# Patient Record
Sex: Female | Born: 1945
Health system: Southern US, Community
[De-identification: ages and names within clinical notes are randomized; demographics above are authoritative.]

## PROBLEM LIST (undated history)

## (undated) DIAGNOSIS — M1990 Unspecified osteoarthritis, unspecified site: Secondary | ICD-10-CM

## (undated) DIAGNOSIS — I10 Essential (primary) hypertension: Secondary | ICD-10-CM

## (undated) DIAGNOSIS — E785 Hyperlipidemia, unspecified: Secondary | ICD-10-CM

## (undated) DIAGNOSIS — F419 Anxiety disorder, unspecified: Secondary | ICD-10-CM

## (undated) DIAGNOSIS — C50919 Malignant neoplasm of unspecified site of unspecified female breast: Secondary | ICD-10-CM

## (undated) DIAGNOSIS — Z8249 Family history of ischemic heart disease and other diseases of the circulatory system: Secondary | ICD-10-CM

## (undated) DIAGNOSIS — B009 Herpesviral infection, unspecified: Secondary | ICD-10-CM

## (undated) DIAGNOSIS — D510 Vitamin B12 deficiency anemia due to intrinsic factor deficiency: Secondary | ICD-10-CM

## (undated) DIAGNOSIS — R600 Localized edema: Secondary | ICD-10-CM

## (undated) DIAGNOSIS — F17201 Nicotine dependence, unspecified, in remission: Secondary | ICD-10-CM

## (undated) DIAGNOSIS — G709 Myoneural disorder, unspecified: Secondary | ICD-10-CM

## (undated) DIAGNOSIS — Z87891 Personal history of nicotine dependence: Secondary | ICD-10-CM

## (undated) DIAGNOSIS — G4730 Sleep apnea, unspecified: Secondary | ICD-10-CM

## (undated) DIAGNOSIS — I77810 Thoracic aortic ectasia: Secondary | ICD-10-CM

## (undated) DIAGNOSIS — E039 Hypothyroidism, unspecified: Secondary | ICD-10-CM

## (undated) DIAGNOSIS — M79606 Pain in leg, unspecified: Secondary | ICD-10-CM

## (undated) DIAGNOSIS — Z923 Personal history of irradiation: Secondary | ICD-10-CM

## (undated) HISTORY — DX: Sleep apnea, unspecified: G47.30

## (undated) HISTORY — DX: Localized edema: R60.0

## (undated) HISTORY — PX: APPENDECTOMY: SHX54

## (undated) HISTORY — DX: Unspecified osteoarthritis, unspecified site: M19.90

## (undated) HISTORY — DX: Hypothyroidism, unspecified: E03.9

## (undated) HISTORY — DX: Herpesviral infection, unspecified: B00.9

## (undated) HISTORY — DX: Anxiety disorder, unspecified: F41.9

## (undated) HISTORY — PX: TUBAL LIGATION: SHX77

## (undated) HISTORY — PX: TONSILLECTOMY: SUR1361

## (undated) HISTORY — PX: TYMPANOPLASTY: SHX33

## (undated) HISTORY — PX: ARTHROSCOPY WITH ANTERIOR CRUCIATE LIGAMENT (ACL) REPAIR WITH ANTERIOR TIBILIAS GRAFT: SHX6503

## (undated) HISTORY — DX: Hyperlipidemia, unspecified: E78.5

## (undated) HISTORY — DX: Myoneural disorder, unspecified: G70.9

## (undated) HISTORY — DX: Vitamin B12 deficiency anemia due to intrinsic factor deficiency: D51.0

## (undated) HISTORY — PX: JOINT REPLACEMENT: SHX530

---
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
## 2008-04-29 HISTORY — PX: TOTAL HIP ARTHROPLASTY: SHX124

## 2011-08-02 ENCOUNTER — Ambulatory Visit: Payer: Self-pay

## 2012-01-15 ENCOUNTER — Ambulatory Visit: Payer: Self-pay | Admitting: Internal Medicine

## 2012-03-24 ENCOUNTER — Ambulatory Visit: Payer: Self-pay | Admitting: Podiatry

## 2012-04-29 HISTORY — PX: BREAST LUMPECTOMY: SHX2

## 2013-01-15 ENCOUNTER — Ambulatory Visit: Payer: Self-pay | Admitting: Internal Medicine

## 2013-01-27 ENCOUNTER — Ambulatory Visit: Payer: Self-pay | Admitting: Internal Medicine

## 2013-01-27 DIAGNOSIS — C50919 Malignant neoplasm of unspecified site of unspecified female breast: Secondary | ICD-10-CM

## 2013-01-27 HISTORY — DX: Malignant neoplasm of unspecified site of unspecified female breast: C50.919

## 2013-02-01 ENCOUNTER — Ambulatory Visit: Payer: Self-pay | Admitting: Internal Medicine

## 2013-02-17 LAB — PATHOLOGY REPORT

## 2013-02-18 ENCOUNTER — Ambulatory Visit: Payer: Self-pay | Admitting: Radiation Oncology

## 2013-02-18 ENCOUNTER — Ambulatory Visit: Payer: Self-pay | Admitting: Surgery

## 2013-02-18 LAB — CBC WITH DIFFERENTIAL/PLATELET
Basophil #: 0 10*3/uL (ref 0.0–0.1)
Eosinophil #: 0.1 10*3/uL (ref 0.0–0.7)
Eosinophil %: 1.8 %
HCT: 39.2 % (ref 35.0–47.0)
HGB: 13.7 g/dL (ref 12.0–16.0)
Lymphocyte %: 17.8 %
MCV: 94 fL (ref 80–100)
Monocyte #: 0.5 x10 3/mm (ref 0.2–0.9)
Monocyte %: 10 %
Neutrophil %: 70 %
RDW: 13.9 % (ref 11.5–14.5)
WBC: 4.8 10*3/uL (ref 3.6–11.0)

## 2013-02-18 LAB — BASIC METABOLIC PANEL
Anion Gap: 3 — ABNORMAL LOW (ref 7–16)
BUN: 14 mg/dL (ref 7–18)
Calcium, Total: 9.4 mg/dL (ref 8.5–10.1)
Chloride: 103 mmol/L (ref 98–107)
Co2: 29 mmol/L (ref 21–32)
Creatinine: 0.75 mg/dL (ref 0.60–1.30)
EGFR (Non-African Amer.): >60
Glucose: 87 mg/dL (ref 65–99)
Potassium: 4.6 mmol/L (ref 3.5–5.1)
Sodium: 135 mmol/L — ABNORMAL LOW (ref 136–145)

## 2013-02-25 ENCOUNTER — Ambulatory Visit: Payer: Self-pay | Admitting: Surgery

## 2013-02-25 HISTORY — PX: BREAST BIOPSY: SHX20

## 2013-02-27 ENCOUNTER — Ambulatory Visit: Payer: Self-pay | Admitting: Radiation Oncology

## 2013-03-29 ENCOUNTER — Ambulatory Visit: Payer: Self-pay | Admitting: Radiation Oncology

## 2013-04-08 ENCOUNTER — Ambulatory Visit: Payer: Self-pay | Admitting: Podiatry

## 2013-04-15 LAB — CBC CANCER CENTER
Basophil %: 0.7 %
Eosinophil #: 0.1 x10 3/mm (ref 0.0–0.7)
Eosinophil %: 2.3 %
Lymphocyte %: 16.6 %
MCH: 31.7 pg (ref 26.0–34.0)
MCHC: 33.4 g/dL (ref 32.0–36.0)
MCV: 95 fL (ref 80–100)
Monocyte #: 0.4 x10 3/mm (ref 0.2–0.9)
Platelet: 194 x10 3/mm (ref 150–440)
RBC: 4.3 10*6/uL (ref 3.80–5.20)
WBC: 4.1 x10 3/mm (ref 3.6–11.0)

## 2013-04-29 ENCOUNTER — Ambulatory Visit: Payer: Self-pay | Admitting: Radiation Oncology

## 2013-04-29 HISTORY — PX: BUNIONECTOMY: SHX129

## 2013-05-06 LAB — CBC CANCER CENTER
BASOS ABS: 0 x10 3/mm (ref 0.0–0.1)
Basophil %: 0.5 %
Eosinophil #: 0.1 x10 3/mm (ref 0.0–0.7)
Eosinophil %: 1.5 %
HCT: 41 % (ref 35.0–47.0)
HGB: 13.7 g/dL (ref 12.0–16.0)
Lymphocyte #: 0.6 x10 3/mm — ABNORMAL LOW (ref 1.0–3.6)
Lymphocyte %: 14.5 %
MCH: 31.9 pg (ref 26.0–34.0)
MCHC: 33.3 g/dL (ref 32.0–36.0)
MCV: 96 fL (ref 80–100)
MONO ABS: 0.5 x10 3/mm (ref 0.2–0.9)
Monocyte %: 11.6 %
NEUTROS ABS: 3.1 x10 3/mm (ref 1.4–6.5)
NEUTROS PCT: 71.9 %
PLATELETS: 188 x10 3/mm (ref 150–440)
RBC: 4.28 10*6/uL (ref 3.80–5.20)
RDW: 13.9 % (ref 11.5–14.5)
WBC: 4.3 x10 3/mm (ref 3.6–11.0)

## 2013-05-13 LAB — CBC CANCER CENTER
Basophil #: 0 x10 3/mm (ref 0.0–0.1)
Basophil %: 0.4 %
Eosinophil #: 0.1 x10 3/mm (ref 0.0–0.7)
Eosinophil %: 1.3 %
HCT: 40.8 % (ref 35.0–47.0)
HGB: 13.7 g/dL (ref 12.0–16.0)
LYMPHS ABS: 0.6 x10 3/mm — AB (ref 1.0–3.6)
Lymphocyte %: 12.9 %
MCH: 31.8 pg (ref 26.0–34.0)
MCHC: 33.4 g/dL (ref 32.0–36.0)
MCV: 95 fL (ref 80–100)
MONO ABS: 0.4 x10 3/mm (ref 0.2–0.9)
Monocyte %: 8.8 %
NEUTROS PCT: 76.6 %
Neutrophil #: 3.5 x10 3/mm (ref 1.4–6.5)
Platelet: 202 x10 3/mm (ref 150–440)
RBC: 4.29 10*6/uL (ref 3.80–5.20)
RDW: 14.2 % (ref 11.5–14.5)
WBC: 4.6 x10 3/mm (ref 3.6–11.0)

## 2013-05-20 LAB — CBC CANCER CENTER
Basophil #: 0 x10 3/mm (ref 0.0–0.1)
Basophil %: 0.6 %
Eosinophil #: 0.1 x10 3/mm (ref 0.0–0.7)
Eosinophil %: 1.2 %
HCT: 41.5 % (ref 35.0–47.0)
HGB: 14 g/dL (ref 12.0–16.0)
LYMPHS PCT: 11.9 %
Lymphocyte #: 0.6 x10 3/mm — ABNORMAL LOW (ref 1.0–3.6)
MCH: 32 pg (ref 26.0–34.0)
MCHC: 33.8 g/dL (ref 32.0–36.0)
MCV: 95 fL (ref 80–100)
MONOS PCT: 8.8 %
Monocyte #: 0.4 x10 3/mm (ref 0.2–0.9)
Neutrophil #: 3.9 x10 3/mm (ref 1.4–6.5)
Neutrophil %: 77.5 %
Platelet: 193 x10 3/mm (ref 150–440)
RBC: 4.38 10*6/uL (ref 3.80–5.20)
RDW: 14 % (ref 11.5–14.5)
WBC: 5 x10 3/mm (ref 3.6–11.0)

## 2013-05-24 LAB — CBC CANCER CENTER
BASOS PCT: 0.6 %
Basophil #: 0 x10 3/mm (ref 0.0–0.1)
EOS ABS: 0.1 x10 3/mm (ref 0.0–0.7)
Eosinophil %: 1.4 %
HCT: 39.7 % (ref 35.0–47.0)
HGB: 13.3 g/dL (ref 12.0–16.0)
LYMPHS ABS: 0.5 x10 3/mm — AB (ref 1.0–3.6)
Lymphocyte %: 11.9 %
MCH: 32 pg (ref 26.0–34.0)
MCHC: 33.6 g/dL (ref 32.0–36.0)
MCV: 95 fL (ref 80–100)
MONO ABS: 0.3 x10 3/mm (ref 0.2–0.9)
MONOS PCT: 8.4 %
NEUTROS PCT: 77.7 %
Neutrophil #: 3.1 x10 3/mm (ref 1.4–6.5)
Platelet: 194 x10 3/mm (ref 150–440)
RBC: 4.17 10*6/uL (ref 3.80–5.20)
RDW: 13.7 % (ref 11.5–14.5)
WBC: 4 x10 3/mm (ref 3.6–11.0)

## 2013-05-24 LAB — HEPATIC FUNCTION PANEL A (ARMC)
ALBUMIN: 3.8 g/dL (ref 3.4–5.0)
ALK PHOS: 88 U/L
AST: 25 U/L (ref 15–37)
BILIRUBIN DIRECT: 0.1 mg/dL (ref 0.00–0.20)
Bilirubin,Total: 0.3 mg/dL (ref 0.2–1.0)
SGPT (ALT): 30 U/L (ref 12–78)
Total Protein: 7.2 g/dL (ref 6.4–8.2)

## 2013-05-24 LAB — CREATININE, SERUM
CREATININE: 0.72 mg/dL (ref 0.60–1.30)
EGFR (African American): >60
EGFR (Non-African Amer.): >60

## 2013-05-30 ENCOUNTER — Ambulatory Visit: Payer: Self-pay | Admitting: Radiation Oncology

## 2013-06-27 LAB — HM COLONOSCOPY

## 2013-06-28 ENCOUNTER — Ambulatory Visit: Payer: Self-pay | Admitting: Internal Medicine

## 2013-08-12 ENCOUNTER — Ambulatory Visit: Payer: Self-pay | Admitting: Podiatry

## 2013-09-15 ENCOUNTER — Ambulatory Visit: Payer: Self-pay | Admitting: Physician Assistant

## 2013-11-01 ENCOUNTER — Ambulatory Visit: Payer: Self-pay | Admitting: Internal Medicine

## 2013-11-01 LAB — CBC CANCER CENTER
BASOS PCT: 0.4 %
Basophil #: 0 x10 3/mm (ref 0.0–0.1)
Eosinophil #: 0 x10 3/mm (ref 0.0–0.7)
Eosinophil %: 1 %
HCT: 37.4 % (ref 35.0–47.0)
HGB: 12.7 g/dL (ref 12.0–16.0)
Lymphocyte #: 0.6 x10 3/mm — ABNORMAL LOW (ref 1.0–3.6)
Lymphocyte %: 14.1 %
MCH: 32.2 pg (ref 26.0–34.0)
MCHC: 33.9 g/dL (ref 32.0–36.0)
MCV: 95 fL (ref 80–100)
MONO ABS: 0.3 x10 3/mm (ref 0.2–0.9)
Monocyte %: 7.5 %
NEUTROS ABS: 3.1 x10 3/mm (ref 1.4–6.5)
Neutrophil %: 77 %
Platelet: 187 x10 3/mm (ref 150–440)
RBC: 3.94 10*6/uL (ref 3.80–5.20)
RDW: 13.7 % (ref 11.5–14.5)
WBC: 4 x10 3/mm (ref 3.6–11.0)

## 2013-11-01 LAB — HEPATIC FUNCTION PANEL A (ARMC)
ALK PHOS: 60 U/L
Albumin: 3.7 g/dL (ref 3.4–5.0)
BILIRUBIN TOTAL: 0.3 mg/dL (ref 0.2–1.0)
Bilirubin, Direct: 0.1 mg/dL (ref 0.00–0.20)
SGOT(AST): 22 U/L (ref 15–37)
SGPT (ALT): 27 U/L (ref 12–78)
Total Protein: 6.9 g/dL (ref 6.4–8.2)

## 2013-11-01 LAB — CREATININE, SERUM
CREATININE: 0.69 mg/dL (ref 0.60–1.30)
EGFR (African American): >60
EGFR (Non-African Amer.): >60

## 2013-11-27 ENCOUNTER — Ambulatory Visit: Payer: Self-pay | Admitting: Internal Medicine

## 2013-12-28 LAB — HM MAMMOGRAPHY

## 2013-12-30 ENCOUNTER — Ambulatory Visit: Payer: Self-pay | Admitting: Internal Medicine

## 2014-01-10 LAB — TSH: TSH: 2.9 u[IU]/mL (ref <=5.90)

## 2014-01-10 LAB — LIPID PANEL
CHOLESTEROL: 161 mg/dL (ref 0–200)
HDL: 53 mg/dL (ref 35–70)
LDL Cholesterol: 72 mg/dL
Triglycerides: 182 mg/dL — AB (ref 40–160)

## 2014-01-10 LAB — CBC AND DIFFERENTIAL: Hemoglobin: 13 g/dL (ref 12.0–16.0)

## 2014-01-18 ENCOUNTER — Ambulatory Visit: Payer: Self-pay | Admitting: Internal Medicine

## 2014-01-27 ENCOUNTER — Ambulatory Visit: Payer: Self-pay | Admitting: Internal Medicine

## 2014-02-11 DIAGNOSIS — G6282 Radiation-induced polyneuropathy: Secondary | ICD-10-CM | POA: Insufficient documentation

## 2014-02-11 DIAGNOSIS — G609 Hereditary and idiopathic neuropathy, unspecified: Secondary | ICD-10-CM

## 2014-02-11 DIAGNOSIS — M722 Plantar fascial fibromatosis: Secondary | ICD-10-CM | POA: Insufficient documentation

## 2014-02-11 DIAGNOSIS — M79606 Pain in leg, unspecified: Secondary | ICD-10-CM

## 2014-02-11 DIAGNOSIS — R202 Paresthesia of skin: Secondary | ICD-10-CM

## 2014-02-11 HISTORY — DX: Pain in leg, unspecified: M79.606

## 2014-04-07 ENCOUNTER — Ambulatory Visit: Payer: Self-pay | Admitting: Internal Medicine

## 2014-04-07 LAB — HEPATIC FUNCTION PANEL A (ARMC)
ALBUMIN: 3.5 g/dL (ref 3.4–5.0)
Alkaline Phosphatase: 55 U/L
BILIRUBIN TOTAL: 0.4 mg/dL (ref 0.2–1.0)
Bilirubin, Direct: 0.1 mg/dL (ref 0.0–0.2)
SGOT(AST): 25 U/L (ref 15–37)
SGPT (ALT): 29 U/L
Total Protein: 6.8 g/dL (ref 6.4–8.2)

## 2014-04-07 LAB — CBC CANCER CENTER
Basophil #: 0 x10 3/mm (ref 0.0–0.1)
Basophil %: 0.4 %
EOS PCT: 1 %
Eosinophil #: 0.1 x10 3/mm (ref 0.0–0.7)
HCT: 37.8 % (ref 35.0–47.0)
HGB: 12.5 g/dL (ref 12.0–16.0)
LYMPHS ABS: 0.6 x10 3/mm — AB (ref 1.0–3.6)
LYMPHS PCT: 11.8 %
MCH: 31.9 pg (ref 26.0–34.0)
MCHC: 33.2 g/dL (ref 32.0–36.0)
MCV: 96 fL (ref 80–100)
MONO ABS: 0.5 x10 3/mm (ref 0.2–0.9)
MONOS PCT: 8.9 %
Neutrophil #: 4 x10 3/mm (ref 1.4–6.5)
Neutrophil %: 77.9 %
Platelet: 197 x10 3/mm (ref 150–440)
RBC: 3.93 10*6/uL (ref 3.80–5.20)
RDW: 13.6 % (ref 11.5–14.5)
WBC: 5.1 x10 3/mm (ref 3.6–11.0)

## 2014-04-07 LAB — CREATININE, SERUM
CREATININE: 0.77 mg/dL (ref 0.60–1.30)
EGFR (Non-African Amer.): >60

## 2014-04-29 ENCOUNTER — Ambulatory Visit: Payer: Self-pay | Admitting: Internal Medicine

## 2014-06-09 DIAGNOSIS — R202 Paresthesia of skin: Secondary | ICD-10-CM | POA: Diagnosis not present

## 2014-06-09 DIAGNOSIS — M722 Plantar fascial fibromatosis: Secondary | ICD-10-CM | POA: Diagnosis not present

## 2014-06-09 DIAGNOSIS — R270 Ataxia, unspecified: Secondary | ICD-10-CM | POA: Diagnosis not present

## 2014-06-09 DIAGNOSIS — G6282 Radiation-induced polyneuropathy: Secondary | ICD-10-CM | POA: Diagnosis not present

## 2014-07-20 DIAGNOSIS — B001 Herpesviral vesicular dermatitis: Secondary | ICD-10-CM | POA: Diagnosis not present

## 2014-07-20 DIAGNOSIS — Z85820 Personal history of malignant melanoma of skin: Secondary | ICD-10-CM | POA: Diagnosis not present

## 2014-07-20 DIAGNOSIS — Z08 Encounter for follow-up examination after completed treatment for malignant neoplasm: Secondary | ICD-10-CM | POA: Diagnosis not present

## 2014-07-20 DIAGNOSIS — Z1283 Encounter for screening for malignant neoplasm of skin: Secondary | ICD-10-CM | POA: Diagnosis not present

## 2014-07-20 DIAGNOSIS — Z872 Personal history of diseases of the skin and subcutaneous tissue: Secondary | ICD-10-CM | POA: Diagnosis not present

## 2014-08-02 ENCOUNTER — Ambulatory Visit
Admit: 2014-08-02 | Disposition: A | Discharge status: Home / Self Care | Payer: Self-pay | Attending: Internal Medicine | Admitting: Internal Medicine

## 2014-08-02 DIAGNOSIS — R921 Mammographic calcification found on diagnostic imaging of breast: Secondary | ICD-10-CM | POA: Diagnosis not present

## 2014-08-02 DIAGNOSIS — R928 Other abnormal and inconclusive findings on diagnostic imaging of breast: Secondary | ICD-10-CM | POA: Diagnosis not present

## 2014-08-19 NOTE — H&P (Signed)
PATIENT NAME:  Cheyenne Cummings, Cheyenne Cummings MR#:  [id number] DATE OF BIRTH:  [birth year]  DATE OF ADMISSION:  02/25/2013  CHIEF COMPLAINT: Right ductal carcinoma in situ.   HISTORY OF PRESENT ILLNESS: This is a patient with a core biopsy on the right showing DCIS, low-grade cribriform type. She is here for elective right partial mastectomy with sentinel node biopsy.   PAST MEDICAL HISTORY: Anxiety disorder, edema, hyperlipidemia, hypothyroidism and osteoarthritis.   PAST SURGICAL HISTORY: Tonsillectomy, adenoidectomy, foot surgery, appendectomy and hip replacement.   FAMILY HISTORY: No history of breast cancer.  GYNECOLOGIC HISTORY: G2, P2, Ab0.   SOCIAL HISTORY: The patient is a retired Radio producer. Smokes tobacco. Does not drink alcohol.   REVIEW OF SYSTEMS: Ten-system review has been performed and negative with the exception of that mentioned in the HPI.  PHYSICAL EXAMINATION: GENERAL: Healthy-appearing female patient.  HEENT: No scleral icterus.  NECK: No palpable neck nodes.  CHEST: Clear to auscultation.  CARDIAC: Regular rate and rhythm.  ABDOMEN: Soft, nontender.  EXTREMITIES: Without edema.  NEUROLOGIC: Grossly intact.  SKIN: Right-sided breast biopsy site is healing well without associated mass.   LABORATORY VALUES: Normal electrolytes are noted. Hemoglobin and hematocrit of 13.7 and 39 and a platelet count of 196.   ASSESSMENT AND PLAN: This is a patient with ductal carcinoma in situ, low-grade, of the right breast on core needle biopsy. She requires further excision for margins, will likely require radiotherapy. A sentinel node biopsy will be performed with needle localization, breast partial mastectomy. The plan for this has been reviewed with her. The risks of bleeding, infection, recurrence of disease and need for further therapy either as anti-estrogen or additional chemotherapy, the risk of invasive component being identified changing her stage and necessitating additional  therapy, and also the need for radiotherapy have been discussed. She is seeing Dr. Baruch Gouty and the plan has been agreed upon.   Also of note, she had some fullness in the anterior mediastinum seen on chest x-ray, and a CT scan will be scheduled for postop in the near future.    ____________________________ Jerrol Banana Burt Knack, MD rec:jm D: 02/24/2013 17:09:44 ET T: 02/24/2013 18:22:26 ET JOB#: [id number]  cc: Jerrol Banana. Burt Knack, MD, <Dictator> Florene Glen MD ELECTRONICALLY SIGNED 02/25/2013 18:26

## 2014-08-19 NOTE — Consult Note (Signed)
Reason for Visit: This 69 year old Female patient presents to the clinic for initial evaluation of  breast cancer .   Referred by Dr. Burt Knack.  Diagnosis:  Chief Complaint/Diagnosis   probable stage 0 (Tis N0 M0) ductal carcinoma in situ of the right breast seen prior to lumpectomy  Pathology Report pathology report reviewed   Imaging Report mammograms reviewed   Referral Report clinical notes reviewed   Planned Treatment Regimen probable whole breast radiation   HPI   patient is a69 year old female who presents with an abnormal mammogram of the right breast showing suspicious calcifications in the 3:00 position. Recommend him patient of biopsy was made based on the pleomorphic nature in the upper right breast approximately 3:00 position. She underwent a core biopsy by Dr. Burt Knack showing ductal carcinoma in situ andmicropapular and perform pattern. Tumor was strongly ER/PR positive. She is now scheduled for lumpectomy and possible sentinel node biopsy and is seen today prior to any did definitive treatment. She is doing well and without complaint. She specifically denies breast tenderness cough or bone pain.  Past Hx:    DCIS:    Depression/Anxiety:    Hypothyroidism:    Hypercholesterolemia:   Past, Family and Social History:  Past Medical History positive   Cardiovascular hyperlipidemia   Neurological/Psychiatric anxiety; depression   Family History positive   Family History Comments sister with diverticulitis, maternal grandmother with cancer of unknown type   Social History positive   Social History Comments no smoking history positive social EtOH use history   Additional Past Medical and Surgical History seen accompanied by nurse navigator today   Allergies:   No Known Allergies:   Home Meds:  Home Medications: Medication Instructions Status  levothyroxine 75 mcg (0.075 mg) oral tablet 1 tab(s) orally once a day Active  lovastatin 40 mg oral tablet 1 tab(s)  orally once a day (at bedtime) Active  PROzac 20 mg oral capsule 1 cap(s) orally once a day Active  azelastine nasal 137 mcg/inh nasal spray 2 spray(s) nasal 2 times a day, As Needed Active  Tylenol PM 500 mg-25 mg oral tablet 1 tab(s) orally once a day (at bedtime), As Needed Active  calcium citrate 1 tab(s) orally once a day 500mg  Active  Vitamin D3 1000 intl units oral capsule 1 cap(s) orally once a day Active  mvi 1 tab(s) orally once a day Active  Vitamin B-12 1 tab(s) sublingual once a day 2536mcq Active  naproxen sodium sodium 220 mg oral tablet 1 tab(s) orally every 8 hours, As Needed Active  Allegra 180 mg oral tablet 1 tab(s) orally once a day Active  acyclovir 200 mg oral capsule 1 cap(s) orally 5 times a day, As Needed Active   Review of Systems:  General negative   Performance Status (ECOG) 0   Skin negative   Breast see HPI   Ophthalmologic negative   ENMT negative   Respiratory and Thorax negative   Cardiovascular negative   Gastrointestinal negative   Genitourinary negative   Musculoskeletal negative   Neurological negative   Psychiatric negative   Hematology/Lymphatics negative   Endocrine negative   Allergic/Immunologic negative   Review of Systems   review of systems obtained from nurses notes  Nursing Notes:  Nursing Vital Signs and Chemo Nursing Nursing Notes: *CC Vital Signs Flowsheet:   23-Oct-14 10:12  Temp Temperature 98.7  Pulse Pulse 69  Respirations Respirations 20  SBP SBP 134  DBP DBP 88  Pain Scale (0-10)  0  Current Weight (kg) (kg) 64.6  Height (cm) centimeters 162.5  BSA (m2) 1.6   Physical Exam:  General/Skin/HEENT:  General normal   Skin normal   Eyes normal   ENMT normal   Head and Neck normal   Additional PE well-developed well-nourished female in NAD. Lungs are clear to A&P cardiac examination shows regular rate and rhythm. No dominant mass or nodularity is noted in either breast into position examined.  No axillary or supraclavicular adenopathy is identified.   Breasts/Resp/CV/GI/GU:  Respiratory and Thorax normal   Cardiovascular normal   Gastrointestinal normal   Genitourinary normal   MS/Neuro/Psych/Lymph:  Musculoskeletal normal   Neurological normal   Lymphatics normal   Other Results:  Radiology Results: LabUnknown:    01-Oct-14 09:15, Digital Additional Views Rt Breast (SCR)  PACS Image   [facility]:  Digital Additional Views Rt Breast (SCR)   REASON FOR EXAM:    AV RT CALCIFICATIONS  COMMENTS:       PROCEDURE: MAM - MAM DIG ADDVIEWS RT SCR  - Jan 27 2013  9:15AM     CLINICAL DATA:  Screening recall for right breast calcifications    EXAM:  DIGITAL DIAGNOSTIC rightMAMMOGRAM    COMPARISON:Previous mammograms.    ACR Breast Density Category b: There is a scattered fibroglandular  pattern.  FINDINGS:  There are pleomorphic calcifications in the outer right breast at  the approximate 3 o'clock position spanning a distance of  approximately 15 mm. This is confirmed on the additional spot  compression magnification views.     IMPRESSION:  Suspicious right breast calcifications.    RECOMMENDATION:  Stereotactic guided biopsy of the suspicious calcifications in the  right breast at 3 o'clock is recommended. The patient's physician  will 1st be notified and then this will be scheduled at the  patient's earliest convenience.  I have discussed the findings and recommendations with the patient.  Results were also provided in writing at the conclusion of the  visit. If applicable, a reminder letter will be sent to the patient  regarding the next appointment.    BI-RADS CATEGORY  4: Suspicious abnormality - biopsy should be  considered.      Electronically Signed    By: Everlean Alstrom M.D.    On: 01/27/2013 13:58         Verified By: Trudee Kuster, M.D.,   Relevent Results:   Relevant Scans and Labs mammograms were reviewed   Assessment  and Plan: Impression:   probable ductal carcinoma in situ of the right breast prior to definitive lumpectomy in 69 old female. Plan:   at this time I got over treatment recommendations for adjuvant radiation therapy. Based on her. DCIS nature she is cautionary for accelerated partial breast irradiation by PCR guidelines. I would opt to treat her whole breast to 5000 cGy and boost or scar depending on final pathology results. She scheduled next week for wide local excision and I'll followup 2 weeks after that for more definitive recommendations about her treatments. She also would be a candidate for tamoxifen therapy based on her ER/PR status after completion of radiation. All questions concerns were answered.  I would like to take this opportunity to thank you for allowing me to continue to participate in this patient's care.  CC Referral:  cc: Dr. Burt Knack, Dr. Army Melia   Electronic Signatures: Baruch Gouty, Roda Shutters (MD)  (Signed 23-Oct-14 12:32)  Authored: HPI, Diagnosis, Past Hx, PFSH, Allergies, Home Meds, ROS, Nursing Notes, Physical Exam,  Other Results, Relevent Results, Encounter Assessment and Plan, CC Referring Physician   Last Updated: 23-Oct-14 12:32 by Armstead Peaks (MD)

## 2014-08-19 NOTE — Op Note (Signed)
PATIENT NAME:  Cheyenne Cummings, Cheyenne Cummings MR#:  [id number] DATE OF BIRTH:  [birth year]  DATE OF PROCEDURE:  02/25/2013  PREOPERATIVE DIAGNOSIS: Right breast ductal carcinoma in situ.  POSTOPERATIVE DIAGNOSIS: Right breast ductal carcinoma in situ.  PROCEDURE: Right needle localized partial mastectomy and right sentinel node biopsy.   SURGEON: Phoebe Perch, M.D.   ANESTHESIA: General with LMA.   INDICATIONS: This is a patient with DCIS on core needle biopsy, but a fairly diffuse area seen on mammography. Preoperatively, we discussed rationale for partial mastectomy with needle localization. We also discussed the controversy and the possibility of a sentinel node biopsy.   I reviewed the films with the radiologist prior to the beginning of the surgery discussing the diffuse nature of this disease.   He had bracketed the radiographic margins with 3 wires and this was reviewed preoperatively.   The patient and I reviewed the films and discussed the procedure itself and the rationale for offering sentinel node biopsy in a diffuse area that may contain invasive components not representative of the core needle biopsy. This was reviewed for her and she understood and agreed to proceed. We also discussed the risks of bleeding, infection, recurrence of disease and need for additional therapy including radiotherapy, suppression therapy and possible cytotoxic chemotherapy if invasive complements were noted. She understood and agreed to proceed.   FINDINGS: Bracketed area with 3 needle localization wires. One of the wires was cut at the tip. The tip was not seen in the specimen nor in the wound itself nor was it palpable and was believed to be within the specimen. This was on the most medial cephalad portion of the specimen. The specimen was labeled long lateral/short superior sutures.   Sentinel node biopsy was sent for examination.   I discussed with Dr. Luana Cummings of pathology the rationale for doing a sentinel node  biopsy in this patient and discussed the patient's pathology specimens prior to sending them.   DESCRIPTION OF PROCEDURE: The patient was induced to general anesthesia, prepped and draped in sterile fashion. She was injected with radioactive technetium tracer preoperatively and with Lymphazurin blue preoperatively.   Transcutaneous scanning of the right axilla demonstrated an area of high counts. This was marked and an incision was made. Dissection down to what appeared to be 2 nodes was performed. There was very little blue dye in the nodes but they had high counts and these were sent off for examination. A separate area of what appeared to be fatty tissue with no node palpable within it was removed as well and sent off together. Further scanning of the cavity failed to identify any additional counts.   The area was checked for hemostasis, found to be adequate. It was closed with deep sutures of 3-0 Vicryl followed by 4-0 subcuticular Monocryl.   Attention was turned to the 3 wires in the lateral portion of the breast. A lenticular-shaped incision was utilized to draw out and encompass 2 of 3  wires and the core needle biopsy site. The incision was executed with sharp dissection and the third wire was brought into the incision as well. The specimen was labeled with the nylon sutures  as above and dissection was performed in a rather generous portion anteriorly, cephalad and medial. The wire that coursed in that direction was found to not contain the very tip barb portion of the wire. Palpation of the specimen and palpation of the cavity walls failed to identify any further wire within the specimen or in the  patient and it was believed to be within the specimen because of the location within the specimen of the wire itself. The area was anesthetized with additional Marcaine for a total of 30 mL. Hemostasis was with electrocautery. The specimen was passed off for examination.   Further inspection failed to  identify the barb portion of the wire and was believed to be small and left in situ or in the specimen. No specimen mammogram was performed due to the bracketing nature of the wires.   Once assuring that hemostasis was adequate and that it had been checked multiple times, the wound was closed with 2 layers of deep sutures of 3-0 Vicryl followed by 4-0 subcuticular Monocryl. Steri-Strips, Mastisol and sterile dressings were placed on all wounds. She was taken to the recovery room in stable condition to be discharged in the care of her family, follow up in 10 days.   ____________________________ Jerrol Banana Burt Knack, MD rec:sb D: 02/25/2013 13:44:45 ET T: 02/25/2013 14:25:23 ET JOB#: [id number]  cc: Jerrol Banana. Burt Knack, MD, <Dictator> Florene Glen MD ELECTRONICALLY SIGNED 02/25/2013 18:26

## 2014-09-01 ENCOUNTER — Other Ambulatory Visit: Payer: Self-pay

## 2014-09-01 DIAGNOSIS — D0591 Unspecified type of carcinoma in situ of right breast: Secondary | ICD-10-CM

## 2014-09-07 DIAGNOSIS — M79604 Pain in right leg: Secondary | ICD-10-CM | POA: Diagnosis not present

## 2014-09-07 DIAGNOSIS — G6282 Radiation-induced polyneuropathy: Secondary | ICD-10-CM | POA: Diagnosis not present

## 2014-09-07 DIAGNOSIS — M722 Plantar fascial fibromatosis: Secondary | ICD-10-CM | POA: Diagnosis not present

## 2014-09-07 DIAGNOSIS — R270 Ataxia, unspecified: Secondary | ICD-10-CM | POA: Diagnosis not present

## 2014-09-23 DIAGNOSIS — E785 Hyperlipidemia, unspecified: Secondary | ICD-10-CM | POA: Diagnosis not present

## 2014-09-23 DIAGNOSIS — B9689 Other specified bacterial agents as the cause of diseases classified elsewhere: Secondary | ICD-10-CM | POA: Diagnosis not present

## 2014-09-23 DIAGNOSIS — R202 Paresthesia of skin: Secondary | ICD-10-CM | POA: Diagnosis not present

## 2014-09-23 DIAGNOSIS — J329 Chronic sinusitis, unspecified: Secondary | ICD-10-CM | POA: Diagnosis not present

## 2014-10-01 ENCOUNTER — Encounter: Payer: Self-pay | Admitting: Internal Medicine

## 2014-10-01 ENCOUNTER — Other Ambulatory Visit: Payer: Self-pay | Admitting: Internal Medicine

## 2014-10-01 DIAGNOSIS — R600 Localized edema: Secondary | ICD-10-CM | POA: Insufficient documentation

## 2014-10-01 DIAGNOSIS — B009 Herpesviral infection, unspecified: Secondary | ICD-10-CM | POA: Insufficient documentation

## 2014-10-01 DIAGNOSIS — C50919 Malignant neoplasm of unspecified site of unspecified female breast: Secondary | ICD-10-CM | POA: Insufficient documentation

## 2014-10-01 DIAGNOSIS — Z86010 Personal history of colonic polyps: Secondary | ICD-10-CM | POA: Insufficient documentation

## 2014-10-01 DIAGNOSIS — F064 Anxiety disorder due to known physiological condition: Secondary | ICD-10-CM | POA: Insufficient documentation

## 2014-10-01 DIAGNOSIS — D510 Vitamin B12 deficiency anemia due to intrinsic factor deficiency: Secondary | ICD-10-CM | POA: Insufficient documentation

## 2014-10-01 DIAGNOSIS — M1710 Unilateral primary osteoarthritis, unspecified knee: Secondary | ICD-10-CM | POA: Insufficient documentation

## 2014-10-01 DIAGNOSIS — Z87891 Personal history of nicotine dependence: Secondary | ICD-10-CM

## 2014-10-01 DIAGNOSIS — I10 Essential (primary) hypertension: Secondary | ICD-10-CM | POA: Insufficient documentation

## 2014-10-01 DIAGNOSIS — R208 Other disturbances of skin sensation: Secondary | ICD-10-CM | POA: Insufficient documentation

## 2014-10-01 DIAGNOSIS — E039 Hypothyroidism, unspecified: Secondary | ICD-10-CM | POA: Insufficient documentation

## 2014-10-01 DIAGNOSIS — E785 Hyperlipidemia, unspecified: Secondary | ICD-10-CM | POA: Insufficient documentation

## 2014-10-01 DIAGNOSIS — Z8249 Family history of ischemic heart disease and other diseases of the circulatory system: Secondary | ICD-10-CM | POA: Insufficient documentation

## 2014-10-01 DIAGNOSIS — M179 Osteoarthritis of knee, unspecified: Secondary | ICD-10-CM | POA: Insufficient documentation

## 2014-10-01 DIAGNOSIS — J3089 Other allergic rhinitis: Secondary | ICD-10-CM | POA: Insufficient documentation

## 2014-10-01 DIAGNOSIS — I454 Nonspecific intraventricular block: Secondary | ICD-10-CM | POA: Insufficient documentation

## 2014-10-01 HISTORY — DX: Personal history of nicotine dependence: Z87.891

## 2014-10-01 HISTORY — DX: Family history of ischemic heart disease and other diseases of the circulatory system: Z82.49

## 2014-10-06 ENCOUNTER — Inpatient Hospital Stay: Payer: Medicare Other | Attending: Internal Medicine | Admitting: Internal Medicine

## 2014-10-06 ENCOUNTER — Other Ambulatory Visit: Payer: Self-pay | Admitting: *Deleted

## 2014-10-06 ENCOUNTER — Inpatient Hospital Stay: Payer: Medicare Other

## 2014-10-06 ENCOUNTER — Encounter: Payer: Self-pay | Admitting: Internal Medicine

## 2014-10-06 VITALS — BP 126/72 | HR 76 | Temp 98.1°F | Resp 18 | Ht 65.0 in | Wt 142.0 lb

## 2014-10-06 DIAGNOSIS — Z809 Family history of malignant neoplasm, unspecified: Secondary | ICD-10-CM | POA: Diagnosis not present

## 2014-10-06 DIAGNOSIS — M1990 Unspecified osteoarthritis, unspecified site: Secondary | ICD-10-CM | POA: Insufficient documentation

## 2014-10-06 DIAGNOSIS — Z9011 Acquired absence of right breast and nipple: Secondary | ICD-10-CM

## 2014-10-06 DIAGNOSIS — E039 Hypothyroidism, unspecified: Secondary | ICD-10-CM | POA: Diagnosis not present

## 2014-10-06 DIAGNOSIS — G629 Polyneuropathy, unspecified: Secondary | ICD-10-CM

## 2014-10-06 DIAGNOSIS — D0511 Intraductal carcinoma in situ of right breast: Secondary | ICD-10-CM

## 2014-10-06 DIAGNOSIS — F419 Anxiety disorder, unspecified: Secondary | ICD-10-CM

## 2014-10-06 DIAGNOSIS — Z79810 Long term (current) use of selective estrogen receptor modulators (SERMs): Secondary | ICD-10-CM | POA: Diagnosis not present

## 2014-10-06 DIAGNOSIS — Z923 Personal history of irradiation: Secondary | ICD-10-CM | POA: Insufficient documentation

## 2014-10-06 DIAGNOSIS — Z79899 Other long term (current) drug therapy: Secondary | ICD-10-CM | POA: Diagnosis not present

## 2014-10-06 DIAGNOSIS — Z87891 Personal history of nicotine dependence: Secondary | ICD-10-CM

## 2014-10-06 DIAGNOSIS — E785 Hyperlipidemia, unspecified: Secondary | ICD-10-CM | POA: Insufficient documentation

## 2014-10-06 DIAGNOSIS — R609 Edema, unspecified: Secondary | ICD-10-CM | POA: Diagnosis not present

## 2014-10-06 DIAGNOSIS — Z170 Estrogen receptor positive status [ER+]: Secondary | ICD-10-CM | POA: Diagnosis not present

## 2014-10-06 LAB — HEPATIC FUNCTION PANEL
ALK PHOS: 44 U/L (ref 38–126)
ALT: 18 U/L (ref 14–54)
AST: 22 U/L (ref 15–41)
Albumin: 3.6 g/dL (ref 3.5–5.0)
BILIRUBIN TOTAL: 0.3 mg/dL (ref 0.3–1.2)
Total Protein: 6.7 g/dL (ref 6.5–8.1)

## 2014-10-06 LAB — CBC WITH DIFFERENTIAL/PLATELET
BASOS PCT: 1 %
Basophils Absolute: 0 10*3/uL (ref 0–0.1)
Eosinophils Absolute: 0.1 10*3/uL (ref 0–0.7)
Eosinophils Relative: 3 %
HCT: 36.6 % (ref 35.0–47.0)
Hemoglobin: 12.2 g/dL (ref 12.0–16.0)
LYMPHS ABS: 0.7 10*3/uL — AB (ref 1.0–3.6)
LYMPHS PCT: 20 %
MCH: 30.6 pg (ref 26.0–34.0)
MCHC: 33.4 g/dL (ref 32.0–36.0)
MCV: 91.7 fL (ref 80.0–100.0)
MONO ABS: 0.3 10*3/uL (ref 0.2–0.9)
Monocytes Relative: 9 %
Neutro Abs: 2.5 10*3/uL (ref 1.4–6.5)
Neutrophils Relative %: 67 %
PLATELETS: 201 10*3/uL (ref 150–440)
RBC: 3.99 MIL/uL (ref 3.80–5.20)
RDW: 13.4 % (ref 11.5–14.5)
WBC: 3.7 10*3/uL (ref 3.6–11.0)

## 2014-10-06 LAB — CREATININE, SERUM
Creatinine, Ser: 0.66 mg/dL (ref 0.44–1.00)
GFR calc Af Amer: >60 mL/min (ref >60)

## 2014-10-06 NOTE — Progress Notes (Signed)
Pt here for DICS f/u last mammogram 08/02/14 on right breast showing calcifications. No c/o. Quit smoking 4 months ago, no alcohol intake. Eating and drinking good.

## 2014-10-23 NOTE — Progress Notes (Signed)
[place]  Telephone:([phone] Fax:([phone]) [phone]     ID: Cheyenne Cummings OB: 1945-08-12  MR#: [id number]  IOE#:[id number]  No care team member to display  CHIEF COMPLAINT/DIAGNOSIS:  Ductal Carcinoma In Situ (grade 1, micropapillary) of the right breast initially diagnosed by right breast biopsy done 02/01/13, then had right partial mastectomy and sentinel node study on 02/25/13 with surgical pathology reporting DCIS nuclear grade 1, micropapillary type, margins of excision are negative, one sentinel lymph node negative. ER positive (>90%), PR positive (>90%).  Patient completed radiation, then started adjuvant hormonal therapy with Tamoxifen on 05/24/13.    HISTORY OF PRESENT ILLNESS:  Patient returns for continued oncology followup. States she is doing steady, denies new complaints. She has been on tamoxifen treatment since January 2015. States that chronic neuropathy symptoms are same, also has intermittent difficulty in balancing. Otherwise, states she remains. pysically active. Eating well, no unintentional weight loss. Denies any history of DVT, PE, TIA, stroke, or heart attack. Denies feeling any new breast masses on self exam. No other new pain issues, 0/10.   REVIEW OF SYSTEMS:   ROS As in HPI above. In addition, no fever, chills or sweats. No new headaches or focal weakness.  No new mood disturbances. No  sore throat, cough, shortness of breath, sputum, hemoptysis or chest pain. No dizziness or palpitation. No abdominal pain, constipation, diarrhea, dysuria or hematuria. No new skin rash or bleeding symptoms. No polyuria polydipsia.    PAST MEDICAL HISTORY: Reviewed. Past Medical History  Diagnosis Date  . Hyperlipidemia   . Hypothyroidism   . Osteoarthritis   . Anxiety disorder   . Herpes simplex   . Edema extremities     PAST SURGICAL HISTORY: Reviewed. Past Surgical History  Procedure Laterality Date  . Breast lumpectomy Right 2014    DCIS - XRT  and Chemo  . Tonsillectomy    . Total hip arthroplasty  2010  . Arthroscopy with anterior cruciate ligament (acl) repair with anterior tibilias graft Bilateral 1996, 2003  . Tympanoplasty    . Bunionectomy Right 2015  . Breast lumpectomy Right     FAMILY HISTORY: Reviewed. Family History  Problem Relation Age of Onset  . Cancer Maternal Grandmother     ADVANCED DIRECTIVES:  Yes  SOCIAL HISTORY: Reviewed. History  Substance Use Topics  . Smoking status: Former Smoker    Quit date: 04/29/2014  . Smokeless tobacco: Never Used  . Alcohol Use: Not on file    Allergies  Allergen Reactions  . Ace Inhibitors Cough    Current Outpatient Prescriptions  Medication Sig Dispense Refill  . acyclovir (ZOVIRAX) 200 MG capsule Take 1 capsule by mouth 3 (three) times daily as needed (for fever blisters).     . cholecalciferol (VITAMIN D) 1000 UNITS tablet Take 1,000 Units by mouth daily.    . clonazePAM (KLONOPIN) 0.5 MG tablet Take 0.5 mg by mouth 2 (two) times daily.    . cyanocobalamin 1000 MCG tablet Take 1 tablet by mouth daily.    . fexofenadine (ALLEGRA) 180 MG tablet Take 1 tablet by mouth daily.    Marland Kitchen levothyroxine (SYNTHROID, LEVOTHROID) 75 MCG tablet Take 1 tablet by mouth daily.    Marland Kitchen losartan (COZAAR) 50 MG tablet Take 1 tablet by mouth daily.    Marland Kitchen lovastatin (MEVACOR) 40 MG tablet Take 1 tablet by mouth at  bedtime 90 tablet 3  . Multiple Vitamins-Minerals (MULTIVITAMIN WITH MINERALS) tablet Take 1 tablet by mouth daily.    Marland Kitchen  Naproxen Sodium 220 MG CAPS Take 1 capsule by mouth 2 (two) times daily as needed.    . tamoxifen (NOLVADEX) 20 MG tablet Take 1 tablet by mouth daily.    Marland Kitchen venlafaxine XR (EFFEXOR-XR) 75 MG 24 hr capsule Take 1 capsule by mouth daily.     No current facility-administered medications for this visit.    PHYSICAL EXAM: Filed Vitals:   10/06/14 0919  BP: 126/72  Pulse: 76  Temp: 98.1 F (36.7 C)  Resp: 18     Body mass index is 23.63 kg/(m^2).       GENERAL: Patient is alert and oriented and in no acute distress. There is no icterus. HEENT: EOMs intact.  No cervical lymphadenopathy. CVS: S1S2, regular LUNGS: Bilaterally clear to auscultation, no rhonchi. ABDOMEN: Soft, nontender. No hepatomegaly clinically.  EXTREMITIES: No pedal edema.    LAB RESULTS:    Component Value Date/Time   NA 135* 02/18/2013 0913   K 4.6 02/18/2013 0913   CL 103 02/18/2013 0913   CO2 29 02/18/2013 0913   GLUCOSE 87 02/18/2013 0913   BUN 14 02/18/2013 0913   CREATININE 0.66 10/06/2014 0855   CREATININE 0.77 04/07/2014 0945   CALCIUM 9.4 02/18/2013 0913   PROT 6.7 10/06/2014 0855   PROT 6.8 04/07/2014 0945   ALBUMIN 3.6 10/06/2014 0855   ALBUMIN 3.5 04/07/2014 0945   AST 22 10/06/2014 0855   AST 25 04/07/2014 0945   ALT 18 10/06/2014 0855   ALT 29 04/07/2014 0945   ALKPHOS 44 10/06/2014 0855   ALKPHOS 55 04/07/2014 0945   BILITOT 0.3 10/06/2014 0855   GFRNONAA >60 10/06/2014 0855   GFRNONAA >60 11/01/2013 1128   GFRAA >60 10/06/2014 0855   GFRAA >60 11/01/2013 1128   Lab Results  Component Value Date   WBC 3.7 10/06/2014   NEUTROABS 2.5 10/06/2014   HGB 12.2 10/06/2014   HCT 36.6 10/06/2014   MCV 91.7 10/06/2014   PLT 201 10/06/2014     STUDIES: 01/18/14 - Mammogram.  IMPRESSION:  Probably benign right breast calcifications. RECOMMENDATION: Right diagnostic mammogram in 6 months. BI-RADS CATEGORY  3: Probably benign finding(s) - short interval follow-up suggested.  ASSESSMENT / PLAN:   Ductal Carcinoma In Situ (grade 1, micropapillary) of the right breast initially diagnosed by right breast biopsy done 02/01/2013, then had right partial mastectomy and sentinel node study on 02/25/2013 with surgical pathology reporting DCIS nuclear grade 1, micropapillary type, margins of excision are negative, one sentinel lymph node negative. ER positive (>90%), PR positive (>90%). Patient completed radiation, then started adjuvant hormonal  therapy with Tamoxifen on 05/24/13  -   Reviewed labs and d/w patient. Overall doing steady clinically. She still has neuropathy in both lower extremities of unclear etiology, and intermittent balance issues. She is considering seeing Neurologist at [facility] Have explained to patient that this is unusual side effect from tamoxifen, also she did give a trial in recent past off of tamoxifen but there was no improvement in these symptoms. Patient wants to continue on taking Tamoxifen, plan is to complete course of 5 years. Will get Bilateral diagnostic mammogram on 01/24/15 for annual surveillance, h/o right breast DCIS. Next MD f/u at 52 weeks with labs. In between visits, patient advised to call or come to ER in case of any progressive symptoms or acute sickness. She is agreeable to this plan.     Leia Alf, MD   10/23/2014 4:59 PM

## 2014-11-11 ENCOUNTER — Telehealth: Payer: Self-pay | Admitting: Internal Medicine

## 2014-11-11 NOTE — Telephone Encounter (Signed)
Called pt and let her know that I would check on that on Monday. When I had last spoke to the md office they had recvd info on pt and would be contacting her directly with appt. I will check back with neurology office and let her know

## 2014-11-11 NOTE — Telephone Encounter (Signed)
Cheyenne Cummings has never been contacted by either of the two neurologists (Dr. Aldine Contes or Dr. Loretta Plume). Cheyenne Cummings thought they were going to be calling. Please advise.

## 2014-11-25 ENCOUNTER — Telehealth: Payer: Self-pay | Admitting: *Deleted

## 2014-11-25 NOTE — Telephone Encounter (Signed)
Left message that since I last spoke to her I have called different numbers for [facility] neurology.  I have left 2 messages a week for a ref. Coordinator with no rtn call.  Every number I have chosen has not gave me an option for a live person to speak to.  I googled a different way and got a call center for [facility] neurology oupt appt today and called it [phone] and when I spoke to someone it was the [place] office and based on pt living in [place] it would be closer for her to go to [place] office and I was given [phone] and I called that number and spoke to appt. Janeece Riggers and she states that on the [facility] website a lot of the numbers are incorrect and she is not surprised I did not get the right number.  She did see where they rcvd the info on pt on 6/17 but because the MD name on the fax was Dr. Melina Fiddler, and Dr. Isaac Laud and they both work in [employer] office they did not make an appt.  Appt clerk went to make appt but then it flagged for the insurance is not contracted with their office in [place]. The pt. Would have to speak to financial coordinator before they can make an appt the number  [phone].  I have called pt and told her all the above on voicemail and will wait for her to call me back

## 2014-12-07 ENCOUNTER — Encounter: Payer: Self-pay | Admitting: Internal Medicine

## 2014-12-07 ENCOUNTER — Other Ambulatory Visit: Payer: Self-pay | Admitting: Internal Medicine

## 2014-12-07 DIAGNOSIS — G6282 Radiation-induced polyneuropathy: Secondary | ICD-10-CM

## 2014-12-07 DIAGNOSIS — R202 Paresthesia of skin: Secondary | ICD-10-CM

## 2014-12-19 ENCOUNTER — Telehealth: Payer: Self-pay | Admitting: *Deleted

## 2014-12-19 MED ORDER — VENLAFAXINE HCL ER 75 MG PO CP24: 75.0000 mg | ORAL_CAPSULE | Freq: Every day | ORAL | Status: DC

## 2014-12-19 NOTE — Telephone Encounter (Signed)
Escribed

## 2014-12-21 ENCOUNTER — Encounter: Payer: Self-pay | Admitting: Internal Medicine

## 2015-01-06 ENCOUNTER — Ambulatory Visit
Admission: RE | Admit: 2015-01-06 | Discharge: 2015-01-06 | Disposition: A | Discharge status: Home / Self Care | Payer: Medicare Other | Source: Ambulatory Visit | Attending: Radiation Oncology | Admitting: Radiation Oncology

## 2015-01-06 ENCOUNTER — Encounter: Payer: Self-pay | Admitting: Radiation Oncology

## 2015-01-06 VITALS — BP 125/81 | HR 71 | Temp 98.5°F | Resp 20 | Wt 145.8 lb

## 2015-01-06 DIAGNOSIS — C50911 Malignant neoplasm of unspecified site of right female breast: Secondary | ICD-10-CM | POA: Diagnosis not present

## 2015-01-06 NOTE — Progress Notes (Signed)
Radiation Oncology Follow up Note  Name: Cheyenne Cummings   Date:   01/06/2015 MRN:  [id number] DOB: Nov 26, 1945    This 69 y.o. female presents to the clinic today for follow-up for breast cancer.  REFERRING PROVIDER: No ref. provider found  HPI: Patient is a 69 year old female now out 18 months having completed radiation therapy to her right breast for ductal carcinoma in situ ER/PR positive. She is seen today in routine follow-up and is doing well. Follow-up mammograms have been fine. She is not sure exactly when her next mammogram is scheduled. She's currently on tamoxifen tolerating that well. She specifically denies breast tenderness cough or bone pain..  COMPLICATIONS OF TREATMENT: none  FOLLOW UP COMPLIANCE: keeps appointments   PHYSICAL EXAM:  BP 125/81 mmHg  Pulse 71  Temp(Src) 98.5 F (36.9 C)  Resp 20  Wt 145 lb 13.4 oz (66.15 kg) Lungs are clear to A&P cardiac examination essentially unremarkable with regular rate and rhythm. No dominant mass or nodularity is noted in either breast in 2 positions examined. Incision is well-healed. No axillary or supraclavicular adenopathy is appreciated. Cosmetic result is excellent. There is some retraction of the scar based on the horizontal nature of the incision. Cosmetic result is still good. Well-developed well-nourished patient in NAD. HEENT reveals PERLA, EOMI, discs not visualized.  Oral cavity is clear. No oral mucosal lesions are identified. Neck is clear without evidence of cervical or supraclavicular adenopathy. Lungs are clear to A&P. Cardiac examination is essentially unremarkable with regular rate and rhythm without murmur rub or thrill. Abdomen is benign with no organomegaly or masses noted. Motor sensory and DTR levels are equal and symmetric in the upper and lower extremities. Cranial nerves II through XII are grossly intact. Proprioception is intact. No peripheral adenopathy or edema is identified. No motor or sensory levels are  noted. Crude visual fields are within normal range.   RADIOLOGY RESULTS: Mammograms have been ordered for October  PLAN: Present time she continues to do well with no evidence of disease. We have checked and confirmed her mammograms have been ordered for October. I'm please were overall progress. She continues on tamoxifen therapy. I've asked to see her back in 1 year for follow-up. She knows to call sooner with any concerns.  I would like to take this opportunity for allowing me to participate in the care of your patient.Armstead Peaks., MD

## 2015-01-12 ENCOUNTER — Encounter (INDEPENDENT_AMBULATORY_CARE_PROVIDER_SITE_OTHER): Payer: Self-pay

## 2015-01-12 ENCOUNTER — Ambulatory Visit (INDEPENDENT_AMBULATORY_CARE_PROVIDER_SITE_OTHER): Payer: Medicare Other | Admitting: Internal Medicine

## 2015-01-12 ENCOUNTER — Encounter: Payer: Self-pay | Admitting: Internal Medicine

## 2015-01-12 VITALS — BP 110/70 | HR 72 | Ht 64.5 in | Wt 144.0 lb

## 2015-01-12 DIAGNOSIS — I10 Essential (primary) hypertension: Secondary | ICD-10-CM | POA: Diagnosis not present

## 2015-01-12 DIAGNOSIS — R202 Paresthesia of skin: Secondary | ICD-10-CM | POA: Diagnosis not present

## 2015-01-12 DIAGNOSIS — E038 Other specified hypothyroidism: Secondary | ICD-10-CM

## 2015-01-12 DIAGNOSIS — E784 Other hyperlipidemia: Secondary | ICD-10-CM

## 2015-01-12 DIAGNOSIS — Z23 Encounter for immunization: Secondary | ICD-10-CM | POA: Diagnosis not present

## 2015-01-12 DIAGNOSIS — F064 Anxiety disorder due to known physiological condition: Secondary | ICD-10-CM | POA: Diagnosis not present

## 2015-01-12 DIAGNOSIS — E034 Atrophy of thyroid (acquired): Secondary | ICD-10-CM | POA: Diagnosis not present

## 2015-01-12 DIAGNOSIS — Z0000 Encounter for general adult medical examination without abnormal findings: Secondary | ICD-10-CM | POA: Diagnosis not present

## 2015-01-12 DIAGNOSIS — R609 Edema, unspecified: Secondary | ICD-10-CM | POA: Diagnosis not present

## 2015-01-12 DIAGNOSIS — R600 Localized edema: Secondary | ICD-10-CM

## 2015-01-12 DIAGNOSIS — E7849 Other hyperlipidemia: Secondary | ICD-10-CM

## 2015-01-12 DIAGNOSIS — C50911 Malignant neoplasm of unspecified site of right female breast: Secondary | ICD-10-CM

## 2015-01-12 DIAGNOSIS — E039 Hypothyroidism, unspecified: Secondary | ICD-10-CM | POA: Diagnosis not present

## 2015-01-12 LAB — POCT URINALYSIS DIPSTICK
Bilirubin, UA: NEGATIVE
GLUCOSE UA: NEGATIVE
Ketones, UA: NEGATIVE
Leukocytes, UA: NEGATIVE
NITRITE UA: NEGATIVE
PROTEIN UA: NEGATIVE
RBC UA: NEGATIVE
Spec Grav, UA: <=1.005
UROBILINOGEN UA: 0.2
pH, UA: 7

## 2015-01-12 NOTE — Patient Instructions (Addendum)
Health Maintenance  Topic Date Due  . Hepatitis C Screening  May 06, 1945  . DEXA SCAN  09/06/2011  . PNA vac Low Risk Adult (1 of 2 - PCV13) 09/06/2011  . INFLUENZA VACCINE  11/28/2014  . MAMMOGRAM  12/29/2015  . TETANUS/TDAP  05/01/2019  . COLONOSCOPY  06/28/2023  . ZOSTAVAX  Completed   Pneumococcal Conjugate Vaccine: What You Need to Know Your doctor recommends that you, or your child, get a dose of PCV13 today. 1. Why get vaccinated? Pneumococcal conjugate vaccine (called PCV13 or Prevnar 13) is recommended to protect infants and toddlers, and some older children and adults with certain health conditions, from pneumococcal disease. Pneumococcal disease is caused by infection with Streptococcus pneumoniae bacteria. These bacteria can spread from person to person through close contact. Pneumococcal disease can lead to severe health problems, including pneumonia, blood infections, and meningitis. Meningitis is an infection of the covering of the brain. Pneumococcal meningitis is fairly rare (less than 1 case per 100,000 people each year), but it leads to other health problems, including deafness and brain damage. In children, it is fatal in about 1 case out of 10. Children younger than two are at higher risk for serious disease than older children. People with certain medical conditions, people over age 48, and cigarette smokers are also at higher risk. Before vaccine, pneumococcal infections caused many problems each year in the [place] in children younger than 5, including:  more than 700 cases of meningitis,  13,000 blood infections,  about 5 million ear infections, and  about 200 deaths. About 4,000 adults still die each year because of pneumococcal infections. Pneumococcal infections can be hard to treat because some strains are resistant to antibiotics. This makes prevention through vaccination even more important. 2. PCV13 vaccine There are more than 90 types of  pneumococcal bacteria. PCV13 protects against 13 of them. These 13 strains cause most severe infections in children and about half of infections in adults.  PCV13 is routinely given to children at 2, 4, 6, and 46-73 months of age. Children in this age range are at greatest risk for serious diseases caused by pneumococcal infection. PCV13 vaccine may also be recommended for some older children or adults. Your doctor can give you details. A second type of pneumococcal vaccine, called PPSV23, may also be given to some children and adults, including anyone over age 72. There is a separate Vaccine Information Statement for this vaccine. 3. Precautions  Anyone who has ever had a life-threatening allergic reaction to a dose of this vaccine, to an earlier pneumococcal vaccine called PCV7 (or Prevnar), or to any vaccine containing diphtheria toxoid (for example, DTaP), should not get PCV13. Anyone with a severe allergy to any component of PCV13 should not get the vaccine. Tell your doctor if the person being vaccinated has any severe allergies. If the person scheduled for vaccination is sick, your doctor might decide to reschedule the shot on another day. Your doctor can give you more information about any of these precautions. 4. What are the risks of PCV13 vaccine?  With any medicine, including vaccines, there is a chance of side effects. These are usually mild and go away on their own, but serious reactions are also possible. Reported problems associated with PCV13 vary by dose and age, but generally:  About half of children became drowsy after the shot, had a temporary loss of appetite, or had redness or tenderness where the shot was given.  About 1 out of 3 had  swelling where the shot was given.  About 1 out of 3 had a mild fever, and about 1 in 20 had a higher fever (over 102.16F).  Up to about 8 out of 10 became fussy or irritable. Adults receiving the vaccine have reported redness, pain, and  swelling where the shot was given. Mild fever, fatigue, headache, chills, or muscle pain have also been reported. Life-threatening allergic reactions from any vaccine are very rare. 5. What if there is a serious reaction? What should I look for?  Look for anything that concerns you, such as signs of a severe allergic reaction, very high fever, or behavior changes. Signs of a severe allergic reaction can include hives, swelling of the face and throat, difficulty breathing, a fast heartbeat, dizziness, and weakness. These would start a few minutes to a few hours after the vaccination. What should I do?  If you think it is a severe allergic reaction or other emergency that can't wait, call 9-1-1 or get the person to the nearest hospital. Otherwise, call your doctor.  Afterward, the reaction should be reported to the Vaccine Adverse Event Reporting System (VAERS). Your doctor might file this report, or you can do it yourself through the VAERS web site at www.vaers.SamedayNews.es, or by calling 905-076-7641. VAERS is only for reporting reactions. They do not give medical advice. 6. The National Vaccine Injury Compensation Program The [employer] Vaccine Injury Compensation Program (VICP) is a federal program that was created to compensate people who may have been injured by certain vaccines. Persons who believe they may have been injured by a vaccine can learn about the program and about filing a claim by calling 4058312024 or visiting the [place] website at GoldCloset.com.ee. 7. How can I learn more?  Ask your doctor.  Call your local or state health department.  Contact the Centers for Disease Control and Prevention (CDC):  Call 641-848-8425 (1-800-CDC-INFO) or  Visit CDC's website at http://hunter.com/ CDC PCV13 Vaccine VIS (Interim) (06/26/11) Document Released: 02/10/2006 Document Revised: 08/30/2013 Document Reviewed: 06/04/2013 [facility] Patient Information 2015 [place],  [place]. This information is not intended to replace advice given to you by your health care provider. Make sure you discuss any questions you have with your health care provider.

## 2015-01-12 NOTE — Progress Notes (Signed)
Patient: Cheyenne Cummings, Female    DOB: 02/11/1946, 69 y.o.   MRN: [id number] Visit Date: 01/12/2015  Today's Provider: Halina Maidens, MD   Chief Complaint  Patient presents with  . Medicare Wellness  . Hypothyroidism  . Hypertension  . Hyperlipidemia  . Depression   Subjective:    Annual wellness visit Cheyenne Cummings is a 69 y.o. female who presents today for her Subsequent Annual Wellness Visit. She feels well. She reports exercising regularly. She reports she is sleeping well.   ----------------------------------------------------------- Hypertension This is a chronic problem. The problem is unchanged. The problem is controlled. Pertinent negatives include no chest pain, headaches, palpitations or shortness of breath. The current treatment provides significant improvement. Hypertensive end-organ damage includes a thyroid problem.  Thyroid Problem Presents for follow-up visit. Patient reports no cold intolerance, constipation, diaphoresis, fatigue, leg swelling, palpitations or weight gain. The symptoms have been stable. Her past medical history is significant for hyperlipidemia.  Depression        This is a chronic problem.  The onset quality is gradual.   The problem occurs rarely.  The problem has been resolved since onset.  Associated symptoms include no decreased concentration, no fatigue, no myalgias and no headaches.     The symptoms are aggravated by nothing.  Past treatments include SNRIs - Serotonin and norepinephrine reuptake inhibitors.  Compliance with treatment is good.  Previous treatment provided significant relief.  Past medical history includes thyroid problem.   Hyperlipidemia This is a chronic problem. The problem is controlled. Recent lipid tests were reviewed and are normal. There are no known factors aggravating her hyperlipidemia. Pertinent negatives include no chest pain, myalgias or shortness of breath. Current antihyperlipidemic treatment includes statins.    neuropathy - patient states that her neuropathic symptoms are slightly improved. Neurologist has taken her off of gabapentin and placed her on clonazepam half a milligram twice a day.  Review of Systems  Constitutional: Negative for fever, weight gain, diaphoresis, fatigue and unexpected weight change.  HENT: Positive for hearing loss and sinus pressure. Negative for rhinorrhea, sore throat and trouble swallowing.   Eyes: Negative for visual disturbance.  Respiratory: Negative for cough, chest tightness, shortness of breath and wheezing.   Cardiovascular: Negative for chest pain, palpitations and leg swelling.  Gastrointestinal: Negative for abdominal pain and constipation.  Endocrine: Negative for cold intolerance and polyuria.  Genitourinary: Negative for frequency, hematuria and difficulty urinating.  Musculoskeletal: Positive for arthralgias. Negative for myalgias, back pain and gait problem.  Skin: Negative for rash and wound.  Neurological: Positive for numbness. Negative for light-headedness and headaches.  Hematological: Negative for adenopathy.  Psychiatric/Behavioral: Positive for depression. Negative for sleep disturbance, dysphoric mood and decreased concentration.    Social History   Social History  . Marital Status: Married    Spouse Name: N/A  . Number of Children: N/A  . Years of Education: N/A   Occupational History  . Not on file.   Social History Main Topics  . Smoking status: Former Smoker    Quit date: 04/29/2014  . Smokeless tobacco: Never Used  . Alcohol Use: Not on file  . Drug Use: No  . Sexual Activity: Not on file   Other Topics Concern  . Not on file   Social History Narrative    Patient Active Problem List   Diagnosis Date Noted  . Alphaherpesviral disease 10/01/2014  . 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 Familial multiple lipoprotein-type hyperlipidemia 10/01/2014  . Anxiety disorder due  to known physiological condition 10/01/2014  .  Compulsive tobacco user syndrome 10/01/2014  . Laboratory animal allergy 10/01/2014  . Breast CA 10/01/2014  . BB block 10/01/2014  . Edema extremities 10/01/2014  . Essential (primary) hypertension 10/01/2014  . History of colon polyps 10/01/2014  . Family history of aneurysm 10/01/2014  . Arthritis of knee, degenerative 10/01/2014  . Burning sensation of the foot 10/01/2014  . Addison anemia 10/01/2014  . Leg pain 02/11/2014  . Leg paresthesia 02/11/2014  . Plantar fasciitis 02/11/2014  . Neuropathy due to ionizing radiation 02/11/2014    Past Surgical History  Procedure Laterality Date  . Breast lumpectomy Right 2014    DCIS - XRT and Chemo  . Tonsillectomy    . Total hip arthroplasty  2010  . Arthroscopy with anterior cruciate ligament (acl) repair with anterior tibilias graft Bilateral 1996, 2003  . Tympanoplasty    . Bunionectomy Right 2015  . Breast lumpectomy Right     Her family history includes Cancer in her maternal grandmother.    Previous Medications   ACYCLOVIR (ZOVIRAX) 200 MG CAPSULE    Take 1 capsule by mouth 3 (three) times daily as needed (for fever blisters).    CHOLECALCIFEROL (VITAMIN D) 1000 UNITS TABLET    Take 1,000 Units by mouth daily.   CLONAZEPAM (KLONOPIN) 0.5 MG TABLET    Take 0.5 mg by mouth 2 (two) times daily.   CYANOCOBALAMIN 1000 MCG TABLET    Take 1 tablet by mouth daily.   FEXOFENADINE (ALLEGRA) 180 MG TABLET    Take 1 tablet by mouth daily.   LEVOTHYROXINE (SYNTHROID, LEVOTHROID) 75 MCG TABLET    Take 1 tablet by mouth daily.   LOSARTAN (COZAAR) 50 MG TABLET    Take 1 tablet by mouth daily.   LOVASTATIN (MEVACOR) 40 MG TABLET    Take 1 tablet by mouth at  bedtime   MULTIPLE VITAMINS-MINERALS (MULTIVITAMIN WITH MINERALS) TABLET    Take 1 tablet by mouth daily.   NAPROXEN SODIUM 220 MG CAPS    Take 1 capsule by mouth 2 (two) times daily as needed.   TAMOXIFEN (NOLVADEX) 20 MG TABLET    Take 1 tablet by mouth daily.   THIAMINE 100 MG  TABLET    Take by mouth.   VENLAFAXINE XR (EFFEXOR-XR) 75 MG 24 HR CAPSULE    Take 1 capsule (75 mg total) by mouth daily.    Patient Care Team: Glean Hess, MD as PCP - General (Family Medicine)     Objective:   Vitals: BP 110/70 mmHg  Pulse 72  Ht 5' 4.5" (1.638 m)  Wt 144 lb (65.318 kg)  BMI 24.34 kg/m2  Physical Exam  Constitutional: She is oriented to person, place, and time. She appears well-developed and well-nourished. No distress.  HENT:  Head: Normocephalic and atraumatic.  Right Ear: Tympanic membrane and ear canal normal.  Left Ear: Tympanic membrane and ear canal normal.  Eyes: Conjunctivae are normal. Pupils are equal, round, and reactive to light. Right eye exhibits no discharge. Left eye exhibits no discharge. No scleral icterus.  Neck: Normal range of motion. Neck supple. No thyromegaly present.  Cardiovascular: Normal rate, regular rhythm, normal heart sounds and intact distal pulses.   Pulmonary/Chest: Effort normal and breath sounds normal. No respiratory distress. She has no wheezes. Right breast exhibits skin change (postsurgical changes). Right breast exhibits no mass, no nipple discharge and no tenderness. Left breast exhibits no mass, no nipple discharge, no skin change  and no tenderness.  Abdominal: Soft. Bowel sounds are normal. She exhibits no mass. There is no tenderness. There is no guarding.  Musculoskeletal: Normal range of motion. She exhibits edema and tenderness.  Lymphadenopathy:    She has no cervical adenopathy.    She has no axillary adenopathy.  Neurological: She is alert and oriented to person, place, and time. She has normal reflexes.  Skin: Skin is warm, dry and intact. No rash noted.  Psychiatric: She has a normal mood and affect. Her speech is normal and behavior is normal. Thought content normal. Cognition and memory are normal.    Activities of Daily Living In your present state of health, do you have any difficulty performing  the following activities: 01/12/2015  Hearing? Y  Vision? N  Difficulty concentrating or making decisions? N  Walking or climbing stairs? N  Dressing or bathing? N  Doing errands, shopping? N    Fall Risk Assessment Fall Risk  01/06/2015  Falls in the past year? No     Patient reports there are safety devices in place in shower at home.   Depression Screen PHQ 2/9 Scores 01/06/2015  PHQ - 2 Score 0    Cognitive Testing - 6-CIT   Correct? Score   What year is it? yes 0 Yes = 0    No = 4  What month is it? yes 0 Yes = 0    No = 3  Remember:     [place]ia [address], [place], [place]     What time is it? yes 0 Yes = 0    No = 3  Count backwards from 20 to 1 yes 0 Correct = 0    1 error = 2   More than 1 error = 4  Say the months of the year in reverse. yes 0 Correct = 0    1 error = 2   More than 1 error = 4  What address did I ask you to remember? yes 0 Correct = 0  1 error = 2    2 error = 4    3 error = 6    4 error = 8    All wrong = 10       TOTAL SCORE  0/28   Interpretation:  Normal  Normal (0-7) Abnormal (8-28)        Assessment & Plan:     Annual Wellness Visit  Reviewed patient's Family Medical History Reviewed and updated list of patient's medical providers Assessment of cognitive impairment was done Assessed patient's functional ability Established a written schedule for health screening [place] Completed and Reviewed  Exercise Activities and Dietary recommendations Goals    None      Immunization History  Administered Date(s) Administered  . Pneumococcal Polysaccharide-23 04/30/2009  . Tdap 04/30/2009  . Zoster 04/30/2010    Health Maintenance  Topic Date Due  . Hepatitis C Screening  [birth year]  . DEXA SCAN  09/06/2011  . PNA vac Low Risk Adult (1 of 2 - PCV13) 09/06/2011  . INFLUENZA VACCINE  11/28/2014  . MAMMOGRAM  12/29/2015  . TETANUS/TDAP  05/01/2019  . COLONOSCOPY  06/28/2023  . ZOSTAVAX  Completed      Discussed health benefits of physical activity, and encouraged her to engage in regular exercise appropriate for her age and condition.    ------------------------------------------------------------------------------------------------------------ 1. Flu vaccine need - Flu Vaccine QUAD 36+ mos PF IM (Fluarix & Fluzone Quad PF)  2. Annual physical exam Medicare annual wellness completed today  3. Essential (primary) hypertension Controlled on current medication - Comprehensive metabolic panel - POCT urinalysis dipstick  4. Familial multiple lipoprotein-type hyperlipidemia Stable on statin therapy - Lipid panel  5. Hypothyroidism due to acquired atrophy of thyroid Stable on supplementation - TSH  6. Edema extremities Controlled  7. Anxiety disorder due to known physiological condition Doing well on current medication  8. Need for pneumococcal vaccination - Pneumococcal conjugate vaccine 13-valent IM  9. Leg paresthesia Followed by the specialist; symptoms may be slightly improved. Now off gabapentin.  10. Breast CA, right Seen recently by oncology; mammogram and chest x-ray are ordered She may decide to follow-up with me rather than with oncology in the future Continue tamoxifen for another 2-1/2 years   Halina Maidens, MD [facility]  01/12/2015

## 2015-01-13 LAB — COMPREHENSIVE METABOLIC PANEL
ALK PHOS: 52 IU/L (ref 39–117)
ALT: 22 IU/L (ref 0–32)
AST: 27 IU/L (ref 0–40)
Albumin/Globulin Ratio: 1.7 (ref 1.1–2.5)
Albumin: 4.2 g/dL (ref 3.6–4.8)
BILIRUBIN TOTAL: 0.3 mg/dL (ref 0.0–1.2)
BUN / CREAT RATIO: 19 (ref 11–26)
BUN: 14 mg/dL (ref 8–27)
CHLORIDE: 93 mmol/L — AB (ref 97–108)
CO2: 25 mmol/L (ref 18–29)
CREATININE: 0.74 mg/dL (ref 0.57–1.00)
Calcium: 9.2 mg/dL (ref 8.7–10.3)
GFR calc Af Amer: 96 mL/min/{1.73_m2} (ref >59)
GFR calc non Af Amer: 84 mL/min/{1.73_m2} (ref >59)
GLOBULIN, TOTAL: 2.5 g/dL (ref 1.5–4.5)
GLUCOSE: 84 mg/dL (ref 65–99)
Potassium: 4.7 mmol/L (ref 3.5–5.2)
SODIUM: 133 mmol/L — AB (ref 134–144)
Total Protein: 6.7 g/dL (ref 6.0–8.5)

## 2015-01-13 LAB — LIPID PANEL
CHOLESTEROL TOTAL: 178 mg/dL (ref 100–199)
Chol/HDL Ratio: 3.3 ratio units (ref 0.0–4.4)
HDL: 54 mg/dL (ref >39)
LDL CALC: 81 mg/dL (ref 0–99)
TRIGLYCERIDES: 214 mg/dL — AB (ref 0–149)
VLDL Cholesterol Cal: 43 mg/dL — ABNORMAL HIGH (ref 5–40)

## 2015-01-13 LAB — TSH: TSH: 1.78 u[IU]/mL (ref 0.450–4.500)

## 2015-01-17 DIAGNOSIS — Z08 Encounter for follow-up examination after completed treatment for malignant neoplasm: Secondary | ICD-10-CM | POA: Diagnosis not present

## 2015-01-17 DIAGNOSIS — Z872 Personal history of diseases of the skin and subcutaneous tissue: Secondary | ICD-10-CM | POA: Diagnosis not present

## 2015-01-17 DIAGNOSIS — L570 Actinic keratosis: Secondary | ICD-10-CM | POA: Diagnosis not present

## 2015-01-17 DIAGNOSIS — Z1283 Encounter for screening for malignant neoplasm of skin: Secondary | ICD-10-CM | POA: Diagnosis not present

## 2015-01-17 DIAGNOSIS — Z85820 Personal history of malignant melanoma of skin: Secondary | ICD-10-CM | POA: Diagnosis not present

## 2015-01-30 ENCOUNTER — Ambulatory Visit
Admission: RE | Admit: 2015-01-30 | Discharge: 2015-01-30 | Disposition: A | Discharge status: Home / Self Care | Payer: Medicare Other | Source: Ambulatory Visit | Attending: Internal Medicine | Admitting: Internal Medicine

## 2015-01-30 ENCOUNTER — Other Ambulatory Visit: Payer: Self-pay | Admitting: Internal Medicine

## 2015-01-30 DIAGNOSIS — R928 Other abnormal and inconclusive findings on diagnostic imaging of breast: Secondary | ICD-10-CM | POA: Diagnosis not present

## 2015-01-30 DIAGNOSIS — D0511 Intraductal carcinoma in situ of right breast: Secondary | ICD-10-CM

## 2015-01-30 DIAGNOSIS — Z853 Personal history of malignant neoplasm of breast: Secondary | ICD-10-CM | POA: Insufficient documentation

## 2015-01-30 HISTORY — DX: Malignant neoplasm of unspecified site of unspecified female breast: C50.919

## 2015-03-06 ENCOUNTER — Other Ambulatory Visit: Payer: Self-pay | Admitting: Internal Medicine

## 2015-03-17 ENCOUNTER — Encounter: Payer: Self-pay | Admitting: Internal Medicine

## 2015-03-17 ENCOUNTER — Ambulatory Visit (INDEPENDENT_AMBULATORY_CARE_PROVIDER_SITE_OTHER): Payer: Medicare Other | Admitting: Internal Medicine

## 2015-03-17 VITALS — BP 110/70 | HR 72 | Ht 65.0 in | Wt 140.8 lb

## 2015-03-17 DIAGNOSIS — I10 Essential (primary) hypertension: Secondary | ICD-10-CM | POA: Diagnosis not present

## 2015-03-17 DIAGNOSIS — Z13820 Encounter for screening for osteoporosis: Secondary | ICD-10-CM

## 2015-03-17 NOTE — Progress Notes (Signed)
Date:  03/17/2015   Name:  Cheyenne Cummings   DOB:  [birth year]   MRN:  [id number]   Chief Complaint: Hypertension and Hyperlipidemia  Patient presents for general follow-up. She reports doing well. Her medical problems are stable. She is frustrated with her current neurologist and was planning a visit to the specialist at [facility] for her neuropathy. She continues on tamoxifen therapy. We had discussed a bone density last visit but it was not scheduled. She agrees to go ahead with that today.  Review of Systems  Constitutional: Negative for fatigue.  Respiratory: Negative for shortness of breath and wheezing.   Cardiovascular: Positive for palpitations. Negative for chest pain.  Neurological: Positive for numbness. Negative for tremors and headaches.  Psychiatric/Behavioral: Negative for dysphoric mood.    Patient Active Problem List   Diagnosis Date Noted  . Alphaherpesviral disease 10/01/2014  . 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 Familial multiple lipoprotein-type hyperlipidemia 10/01/2014  . Anxiety disorder due to known physiological condition 10/01/2014  . Tobacco use disorder, mild, in sustained remission 10/01/2014  . Laboratory animal allergy 10/01/2014  . Breast CA ([place]) 10/01/2014  . BB block 10/01/2014  . Edema extremities 10/01/2014  . Essential (primary) hypertension 10/01/2014  . History of colon polyps 10/01/2014  . Family history of aneurysm 10/01/2014  . Arthritis of knee, degenerative 10/01/2014  . Addison anemia 10/01/2014  . Leg pain 02/11/2014  . Leg paresthesia 02/11/2014  . Plantar fasciitis 02/11/2014  . 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 Authorizing Provider  acyclovir (ZOVIRAX) 200 MG capsule Take 1 capsule by mouth 3 (three) times daily as needed (for fever blisters).  06/13/14  Yes Historical Provider, MD  cholecalciferol (VITAMIN D) 1000 UNITS tablet Take  1,000 Units by mouth daily.   Yes Historical Provider, MD  clonazePAM (KLONOPIN) 0.5 MG tablet Take 0.5 mg by mouth 2 (two) times daily.   Yes Historical Provider, MD  cyanocobalamin 1000 MCG tablet Take 1 tablet by mouth daily.   Yes Historical Provider, MD  fexofenadine (ALLEGRA) 180 MG tablet Take 1 tablet by mouth daily.   Yes Historical Provider, MD  levothyroxine (SYNTHROID, LEVOTHROID) 75 MCG tablet Take 1 tablet by mouth daily. 06/21/14  Yes Historical Provider, MD  losartan (COZAAR) 50 MG tablet Take 1 tablet by mouth  daily 03/07/15  Yes Glean Hess, MD  lovastatin (MEVACOR) 40 MG tablet Take 1 tablet by mouth at  bedtime 10/01/14  Yes Glean Hess, MD  Multiple Vitamins-Minerals (MULTIVITAMIN WITH MINERALS) tablet Take 1 tablet by mouth daily.   Yes Historical Provider, MD  Naproxen Sodium 220 MG CAPS Take 1 capsule by mouth 2 (two) times daily as needed.   Yes Historical Provider, MD  tamoxifen (NOLVADEX) 20 MG tablet Take 1 tablet by mouth once a day 03/07/15  Yes Lloyd Huger, MD  venlafaxine XR (EFFEXOR-XR) 75 MG 24 hr capsule Take 1 capsule (75 mg total) by mouth daily. 12/19/14  Yes Leia Alf, MD  thiamine 100 MG tablet Take by mouth.    Historical Provider, MD    Allergies  Allergen Reactions  . Ace Inhibitors Cough    Past Surgical History  Procedure Laterality Date  . Breast lumpectomy Right 2014    DCIS - XRT and Chemo  . Tonsillectomy    . Total hip arthroplasty  2010  . Arthroscopy with anterior cruciate ligament (acl) repair with  anterior tibilias graft Bilateral 1996, 2003  . Tympanoplasty    . Bunionectomy Right 2015  . Breast lumpectomy Right     Social History  Substance Use Topics  . Smoking status: Former Smoker    Quit date: 04/29/2014  . Smokeless tobacco: Never Used  . Alcohol Use: None    Medication list has been reviewed and updated.   Physical Exam  Constitutional: She is oriented to person, place, and time. She appears  well-developed. No distress.  HENT:  Head: Normocephalic and atraumatic.  Eyes: Conjunctivae are normal. Right eye exhibits no discharge. Left eye exhibits no discharge. No scleral icterus.  Cardiovascular: Normal rate, regular rhythm and normal heart sounds.   Pulmonary/Chest: Effort normal and breath sounds normal. No respiratory distress.  Musculoskeletal: Normal range of motion.  Neurological: She is alert and oriented to person, place, and time.  Skin: Skin is warm and dry. No rash noted.  Psychiatric: She has a normal mood and affect. Her behavior is normal. Thought content normal.    BP 110/70 mmHg  Pulse 72  Ht 5\' 5"  (1.651 m)  Wt 140 lb 12.8 oz (63.866 kg)  BMI 23.43 kg/m2  Assessment and Plan: 1. Essential (primary) hypertension Well-controlled with recent normal labs  2. Encounter for screening for osteoporosis Continue tamoxifen therapy We discussed treatment for osteoporosis if needed - DG Bone Density; Future   Halina Maidens, MD [facility]  03/17/2015

## 2015-03-28 DIAGNOSIS — M5416 Radiculopathy, lumbar region: Secondary | ICD-10-CM | POA: Diagnosis not present

## 2015-03-28 DIAGNOSIS — G6282 Radiation-induced polyneuropathy: Secondary | ICD-10-CM | POA: Diagnosis not present

## 2015-03-28 DIAGNOSIS — G5791 Unspecified mononeuropathy of right lower limb: Secondary | ICD-10-CM | POA: Diagnosis not present

## 2015-03-28 DIAGNOSIS — G5731 Lesion of lateral popliteal nerve, right lower limb: Secondary | ICD-10-CM | POA: Diagnosis not present

## 2015-03-28 DIAGNOSIS — G629 Polyneuropathy, unspecified: Secondary | ICD-10-CM | POA: Diagnosis not present

## 2015-04-25 ENCOUNTER — Other Ambulatory Visit: Payer: Self-pay | Admitting: Internal Medicine

## 2015-06-05 ENCOUNTER — Other Ambulatory Visit: Payer: Self-pay

## 2015-06-05 MED ORDER — LOSARTAN POTASSIUM 50 MG PO TABS: 50.0000 mg | ORAL_TABLET | Freq: Every day | ORAL | Status: DC

## 2015-06-05 MED ORDER — LEVOTHYROXINE SODIUM 75 MCG PO TABS: 75.0000 ug | ORAL_TABLET | Freq: Every day | ORAL | Status: DC

## 2015-06-05 MED ORDER — LOVASTATIN 40 MG PO TABS: 40.0000 mg | ORAL_TABLET | Freq: Every day | ORAL | Status: DC

## 2015-06-05 MED ORDER — TAMOXIFEN CITRATE 20 MG PO TABS: 20.0000 mg | ORAL_TABLET | Freq: Every day | ORAL | Status: DC

## 2015-06-05 MED ORDER — VENLAFAXINE HCL ER 75 MG PO CP24: 75.0000 mg | ORAL_CAPSULE | Freq: Every day | ORAL | Status: DC

## 2015-06-05 NOTE — Telephone Encounter (Signed)
pts coming in on 09/18/15 for cpe

## 2015-06-05 NOTE — Telephone Encounter (Signed)
Patient came by today requesting refills.

## 2015-06-19 DIAGNOSIS — I10 Essential (primary) hypertension: Secondary | ICD-10-CM | POA: Diagnosis not present

## 2015-06-19 DIAGNOSIS — R69 Illness, unspecified: Secondary | ICD-10-CM | POA: Diagnosis not present

## 2015-06-19 DIAGNOSIS — E785 Hyperlipidemia, unspecified: Secondary | ICD-10-CM | POA: Diagnosis not present

## 2015-06-19 DIAGNOSIS — C50919 Malignant neoplasm of unspecified site of unspecified female breast: Secondary | ICD-10-CM | POA: Diagnosis not present

## 2015-06-19 DIAGNOSIS — E039 Hypothyroidism, unspecified: Secondary | ICD-10-CM | POA: Diagnosis not present

## 2015-06-26 ENCOUNTER — Ambulatory Visit
Admission: RE | Admit: 2015-06-26 | Discharge: 2015-06-26 | Disposition: A | Discharge status: Home / Self Care | Payer: Medicare HMO | Source: Ambulatory Visit | Attending: Internal Medicine | Admitting: Internal Medicine

## 2015-06-26 ENCOUNTER — Ambulatory Visit (INDEPENDENT_AMBULATORY_CARE_PROVIDER_SITE_OTHER): Payer: Medicare HMO | Admitting: Internal Medicine

## 2015-06-26 ENCOUNTER — Encounter: Payer: Self-pay | Admitting: Internal Medicine

## 2015-06-26 VITALS — BP 122/64 | HR 65 | Temp 98.8°F | Ht 65.0 in | Wt 140.0 lb

## 2015-06-26 DIAGNOSIS — R059 Cough, unspecified: Secondary | ICD-10-CM

## 2015-06-26 DIAGNOSIS — R000 Tachycardia, unspecified: Secondary | ICD-10-CM

## 2015-06-26 DIAGNOSIS — R918 Other nonspecific abnormal finding of lung field: Secondary | ICD-10-CM | POA: Insufficient documentation

## 2015-06-26 DIAGNOSIS — R05 Cough: Secondary | ICD-10-CM | POA: Diagnosis not present

## 2015-06-26 DIAGNOSIS — F17200 Nicotine dependence, unspecified, uncomplicated: Secondary | ICD-10-CM | POA: Diagnosis not present

## 2015-06-26 DIAGNOSIS — R0602 Shortness of breath: Secondary | ICD-10-CM | POA: Diagnosis not present

## 2015-06-26 DIAGNOSIS — R69 Illness, unspecified: Secondary | ICD-10-CM | POA: Diagnosis not present

## 2015-06-26 NOTE — Progress Notes (Signed)
Date:  06/26/2015   Name:  Cheyenne Cummings   DOB:  [birth year]   MRN:  [id number]   Chief Complaint: Fatigue Cough This is a new problem. The current episode started in the past 7 days. The problem has been unchanged. The problem occurs hourly. The cough is productive of purulent sputum. Associated symptoms include chest pain, chills, ear pain and shortness of breath. Pertinent negatives include no fever, headaches, sore throat or wheezing. Nothing aggravates the symptoms. The treatment provided mild relief.  She has general fatigue - not relieved by extra sleep over the past 5 days.  She feels a bit short of breath but minimal cough and sputum.  She denies chest pain but has some discomfort in lower back with deep breathing. She has felt that her heart was beating fast at times - seems to make her feel more fatigued.  She is not aware of any irregular heart rate.  She has not been dizzy or felt that she would pass out.    Review of Systems  Constitutional: Positive for chills and fatigue. Negative for fever and diaphoresis.  HENT: Positive for ear pain and sinus pressure. Negative for sore throat.   Eyes: Positive for visual disturbance.  Respiratory: Positive for cough, chest tightness and shortness of breath. Negative for wheezing.   Cardiovascular: Positive for chest pain and palpitations. Negative for leg swelling.  Gastrointestinal: Positive for nausea. Negative for vomiting, abdominal pain and diarrhea.  Musculoskeletal: Positive for arthralgias.  Neurological: Negative for dizziness, syncope, light-headedness and headaches.  Psychiatric/Behavioral: Positive for sleep disturbance.    Patient Active Problem List   Diagnosis Date Noted  . Alphaherpesviral disease 10/01/2014  . 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 Familial multiple lipoprotein-type hyperlipidemia 10/01/2014  . Anxiety disorder due to known physiological condition 10/01/2014  . Tobacco use disorder, mild, in  sustained remission 10/01/2014  . Laboratory animal allergy 10/01/2014  . Breast CA ([place]) 10/01/2014  . BB block 10/01/2014  . Edema extremities 10/01/2014  . Essential (primary) hypertension 10/01/2014  . History of colon polyps 10/01/2014  . Family history of aneurysm 10/01/2014  . Arthritis of knee, degenerative 10/01/2014  . Addison anemia 10/01/2014  . Leg pain 02/11/2014  . Leg paresthesia 02/11/2014  . Plantar fasciitis 02/11/2014  . Neuropathy due to ionizing radiation ([place]) 02/11/2014    Prior to Admission medications   Medication Sig Start Date End Date Taking? Authorizing Provider  acyclovir (ZOVIRAX) 200 MG capsule Take 1 capsule by mouth 3 (three) times daily as needed (for fever blisters).  06/13/14  Yes Historical Provider, MD  cholecalciferol (VITAMIN D) 1000 UNITS tablet Take 1,000 Units by mouth daily.   Yes Historical Provider, MD  clonazePAM (KLONOPIN) 0.5 MG tablet Take 0.5 mg by mouth 2 (two) times daily.   Yes Historical Provider, MD  cyanocobalamin 1000 MCG tablet Take 1 tablet by mouth daily.   Yes Historical Provider, MD  fexofenadine (ALLEGRA) 180 MG tablet Take 1 tablet by mouth daily.   Yes Historical Provider, MD  levothyroxine (SYNTHROID, LEVOTHROID) 75 MCG tablet Take 1 tablet (75 mcg total) by mouth daily. 06/05/15  Yes Glean Hess, MD  losartan (COZAAR) 50 MG tablet Take 1 tablet (50 mg total) by mouth daily. 06/05/15  Yes Glean Hess, MD  lovastatin (MEVACOR) 40 MG tablet Take 1 tablet (40 mg total) by mouth at bedtime. 06/05/15  Yes Glean Hess, MD  Multiple Vitamins-Minerals (MULTIVITAMIN WITH MINERALS) tablet Take 1 tablet by  mouth daily.   Yes Historical Provider, MD  Naproxen Sodium 220 MG CAPS Take 1 capsule by mouth 2 (two) times daily as needed.   Yes Historical Provider, MD  tamoxifen (NOLVADEX) 20 MG tablet Take 1 tablet (20 mg total) by mouth daily. 06/05/15  Yes Glean Hess, MD  venlafaxine XR (EFFEXOR-XR) 75 MG 24 hr capsule Take  1 capsule (75 mg total) by mouth daily. 06/05/15  Yes Glean Hess, MD    Allergies  Allergen Reactions  . Ace Inhibitors Cough    Past Surgical History  Procedure Laterality Date  . Breast lumpectomy Right 2014    DCIS - XRT and Chemo  . Tonsillectomy    . Total hip arthroplasty  2010  . Arthroscopy with anterior cruciate ligament (acl) repair with anterior tibilias graft Bilateral 1996, 2003  . Tympanoplasty    . Bunionectomy Right 2015  . Breast lumpectomy Right     Social History  Substance Use Topics  . Smoking status: Current Every Day Smoker -- 0.50 packs/day    Types: Cigarettes  . Smokeless tobacco: Never Used  . Alcohol Use: 0.0 oz/week    0 Standard drinks or equivalent per week     Comment: wine    Medication list has been reviewed and updated.   Physical Exam  Constitutional: She is oriented to person, place, and time. She appears well-developed. No distress.  HENT:  Head: Normocephalic and atraumatic.  Cardiovascular: Regular rhythm, normal heart sounds and intact distal pulses.   No extrasystoles are present. Tachycardia present.   Pulmonary/Chest: Effort normal. No respiratory distress. She has decreased breath sounds. She has no wheezes. She has no rhonchi.  Musculoskeletal: Normal range of motion.  Neurological: She is alert and oriented to person, place, and time.  Skin: Skin is warm and dry. No rash noted.  Psychiatric: She has a normal mood and affect. Her behavior is normal. Thought content normal.  Nursing note and vitals reviewed.   BP 122/64 mmHg  Pulse 65  Ht 5\' 5"  (1.651 m)  Wt 140 lb (63.504 kg)  BMI 23.30 kg/m2  SpO2 98%  Assessment and Plan: 1. Tachycardia EKG shows unchanged RBBB at 84 bpm - EKG 12-Lead - Comprehensive metabolic panel - TSH  2. Cough Concern for subtle pneumonia Will start antibiotics if labs and/or CXR suggestive - DG Chest 2 View; Future - CBC with Differential/Platelet   Halina Maidens, MD [facility]  06/26/2015

## 2015-06-27 ENCOUNTER — Other Ambulatory Visit: Payer: Self-pay | Admitting: Internal Medicine

## 2015-06-27 ENCOUNTER — Encounter: Payer: Self-pay | Admitting: Internal Medicine

## 2015-06-27 DIAGNOSIS — Z720 Tobacco use: Secondary | ICD-10-CM

## 2015-06-27 LAB — COMPREHENSIVE METABOLIC PANEL
A/G RATIO: 1.7 (ref 1.1–2.5)
ALBUMIN: 4.3 g/dL (ref 3.6–4.8)
ALK PHOS: 53 IU/L (ref 39–117)
ALT: 21 IU/L (ref 0–32)
AST: 28 IU/L (ref 0–40)
BUN / CREAT RATIO: 16 (ref 11–26)
BUN: 10 mg/dL (ref 8–27)
Bilirubin Total: <0.2 mg/dL (ref 0.0–1.2)
CALCIUM: 9.1 mg/dL (ref 8.7–10.3)
CO2: 24 mmol/L (ref 18–29)
CREATININE: 0.64 mg/dL (ref 0.57–1.00)
Chloride: 98 mmol/L (ref 96–106)
GFR calc Af Amer: 106 mL/min/{1.73_m2} (ref >59)
GFR, EST NON AFRICAN AMERICAN: 92 mL/min/{1.73_m2} (ref >59)
GLOBULIN, TOTAL: 2.5 g/dL (ref 1.5–4.5)
Glucose: 82 mg/dL (ref 65–99)
POTASSIUM: 4.4 mmol/L (ref 3.5–5.2)
SODIUM: 135 mmol/L (ref 134–144)
Total Protein: 6.8 g/dL (ref 6.0–8.5)

## 2015-06-27 LAB — CBC WITH DIFFERENTIAL/PLATELET
BASOS: 0 %
Basophils Absolute: 0 10*3/uL (ref 0.0–0.2)
EOS (ABSOLUTE): 0.1 10*3/uL (ref 0.0–0.4)
EOS: 1 %
HEMATOCRIT: 36.2 % (ref 34.0–46.6)
HEMOGLOBIN: 12.2 g/dL (ref 11.1–15.9)
Immature Grans (Abs): 0 10*3/uL (ref 0.0–0.1)
Immature Granulocytes: 0 %
LYMPHS ABS: 1.1 10*3/uL (ref 0.7–3.1)
Lymphs: 25 %
MCH: 31.4 pg (ref 26.6–33.0)
MCHC: 33.7 g/dL (ref 31.5–35.7)
MCV: 93 fL (ref 79–97)
MONOCYTES: 10 %
MONOS ABS: 0.4 10*3/uL (ref 0.1–0.9)
NEUTROS ABS: 2.9 10*3/uL (ref 1.4–7.0)
Neutrophils: 64 %
Platelets: 225 10*3/uL (ref 150–379)
RBC: 3.88 x10E6/uL (ref 3.77–5.28)
RDW: 14.4 % (ref 12.3–15.4)
WBC: 4.5 10*3/uL (ref 3.4–10.8)

## 2015-06-27 LAB — TSH: TSH: 1.81 u[IU]/mL (ref 0.450–4.500)

## 2015-06-30 ENCOUNTER — Ambulatory Visit: Admission: RE | Admit: 2015-06-30 | Payer: Medicare Other | Source: Ambulatory Visit

## 2015-06-30 DIAGNOSIS — D485 Neoplasm of uncertain behavior of skin: Secondary | ICD-10-CM | POA: Diagnosis not present

## 2015-06-30 DIAGNOSIS — Z872 Personal history of diseases of the skin and subcutaneous tissue: Secondary | ICD-10-CM | POA: Diagnosis not present

## 2015-06-30 DIAGNOSIS — L818 Other specified disorders of pigmentation: Secondary | ICD-10-CM | POA: Diagnosis not present

## 2015-07-11 DIAGNOSIS — H93A1 Pulsatile tinnitus, right ear: Secondary | ICD-10-CM | POA: Diagnosis not present

## 2015-07-11 DIAGNOSIS — R42 Dizziness and giddiness: Secondary | ICD-10-CM | POA: Diagnosis not present

## 2015-07-13 ENCOUNTER — Other Ambulatory Visit: Payer: Self-pay | Admitting: Family Medicine

## 2015-07-18 ENCOUNTER — Other Ambulatory Visit: Payer: Self-pay | Admitting: Internal Medicine

## 2015-07-18 DIAGNOSIS — D485 Neoplasm of uncertain behavior of skin: Secondary | ICD-10-CM | POA: Diagnosis not present

## 2015-07-18 DIAGNOSIS — R918 Other nonspecific abnormal finding of lung field: Secondary | ICD-10-CM

## 2015-07-18 DIAGNOSIS — Z08 Encounter for follow-up examination after completed treatment for malignant neoplasm: Secondary | ICD-10-CM | POA: Diagnosis not present

## 2015-07-18 DIAGNOSIS — D225 Melanocytic nevi of trunk: Secondary | ICD-10-CM | POA: Diagnosis not present

## 2015-07-18 DIAGNOSIS — Z85820 Personal history of malignant melanoma of skin: Secondary | ICD-10-CM | POA: Diagnosis not present

## 2015-07-18 DIAGNOSIS — Z1283 Encounter for screening for malignant neoplasm of skin: Secondary | ICD-10-CM | POA: Diagnosis not present

## 2015-07-18 DIAGNOSIS — L818 Other specified disorders of pigmentation: Secondary | ICD-10-CM | POA: Diagnosis not present

## 2015-07-19 ENCOUNTER — Ambulatory Visit: Payer: Medicare Other

## 2015-07-19 ENCOUNTER — Other Ambulatory Visit: Payer: Self-pay | Admitting: Internal Medicine

## 2015-07-19 DIAGNOSIS — R918 Other nonspecific abnormal finding of lung field: Secondary | ICD-10-CM

## 2015-07-19 DIAGNOSIS — R9389 Abnormal findings on diagnostic imaging of other specified body structures: Secondary | ICD-10-CM

## 2015-07-20 ENCOUNTER — Telehealth: Payer: Self-pay

## 2015-07-20 ENCOUNTER — Ambulatory Visit
Admission: RE | Admit: 2015-07-20 | Discharge: 2015-07-20 | Disposition: A | Discharge status: Home / Self Care | Payer: Medicare HMO | Source: Ambulatory Visit | Attending: Internal Medicine | Admitting: Internal Medicine

## 2015-07-20 DIAGNOSIS — R918 Other nonspecific abnormal finding of lung field: Secondary | ICD-10-CM | POA: Diagnosis not present

## 2015-07-20 NOTE — Telephone Encounter (Signed)
-----   Message from Glean Hess, MD sent at 07/20/2015  1:44 PM EDT ----- CXR shows no pulmonary nodule.  There is evidence of old healed chest infection.  No other lung abnormality is noted so no need for chest CT.

## 2015-07-20 NOTE — Telephone Encounter (Signed)
Left message for patient to call back  

## 2015-07-26 NOTE — Telephone Encounter (Signed)
Spoke with patient. Patient advised of all results and verbalized understanding. Will call back with any future questions or concerns. MA[facility]  

## 2015-08-04 DIAGNOSIS — R0989 Other specified symptoms and signs involving the circulatory and respiratory systems: Secondary | ICD-10-CM | POA: Diagnosis not present

## 2015-08-15 ENCOUNTER — Other Ambulatory Visit: Payer: Self-pay

## 2015-08-15 MED ORDER — TAMOXIFEN CITRATE 20 MG PO TABS: 20.0000 mg | ORAL_TABLET | Freq: Every day | ORAL | Status: DC

## 2015-08-15 MED ORDER — LOVASTATIN 40 MG PO TABS: 40.0000 mg | ORAL_TABLET | Freq: Every day | ORAL | Status: DC

## 2015-08-15 MED ORDER — VENLAFAXINE HCL ER 75 MG PO CP24: 75.0000 mg | ORAL_CAPSULE | Freq: Every day | ORAL | Status: DC

## 2015-08-15 MED ORDER — LEVOTHYROXINE SODIUM 75 MCG PO TABS: 75.0000 ug | ORAL_TABLET | Freq: Every day | ORAL | Status: DC

## 2015-08-15 MED ORDER — LOSARTAN POTASSIUM 50 MG PO TABS: 50.0000 mg | ORAL_TABLET | Freq: Every day | ORAL | Status: DC

## 2015-08-15 NOTE — Telephone Encounter (Signed)
Patient called in today and states that she needs refills sent to the pharmacy for the following medications.

## 2015-08-18 ENCOUNTER — Encounter: Payer: Self-pay | Admitting: Internal Medicine

## 2015-08-23 ENCOUNTER — Telehealth: Payer: Self-pay

## 2015-08-23 ENCOUNTER — Other Ambulatory Visit: Payer: Self-pay | Admitting: Internal Medicine

## 2015-08-23 MED ORDER — DIAZEPAM 5 MG PO TABS: 5.0000 mg | ORAL_TABLET | Freq: Two times a day (BID) | ORAL | Status: DC | PRN

## 2015-08-23 NOTE — Telephone Encounter (Signed)
Patient requesting Valium for long flight. Per Carolin Coy can pick up after 130. [facility]

## 2015-08-23 NOTE — Telephone Encounter (Deleted)
Needs Valium fo

## 2015-09-18 ENCOUNTER — Encounter: Payer: Self-pay | Admitting: Internal Medicine

## 2015-09-18 ENCOUNTER — Ambulatory Visit (INDEPENDENT_AMBULATORY_CARE_PROVIDER_SITE_OTHER): Payer: Medicare HMO | Admitting: Internal Medicine

## 2015-09-18 VITALS — BP 108/78 | HR 88 | Resp 16 | Ht 65.0 in | Wt 138.0 lb

## 2015-09-18 DIAGNOSIS — E785 Hyperlipidemia, unspecified: Secondary | ICD-10-CM | POA: Diagnosis not present

## 2015-09-18 DIAGNOSIS — E038 Other specified hypothyroidism: Secondary | ICD-10-CM | POA: Diagnosis not present

## 2015-09-18 DIAGNOSIS — F17201 Nicotine dependence, unspecified, in remission: Secondary | ICD-10-CM | POA: Insufficient documentation

## 2015-09-18 DIAGNOSIS — I10 Essential (primary) hypertension: Secondary | ICD-10-CM | POA: Diagnosis not present

## 2015-09-18 DIAGNOSIS — Z0000 Encounter for general adult medical examination without abnormal findings: Secondary | ICD-10-CM

## 2015-09-18 DIAGNOSIS — F17200 Nicotine dependence, unspecified, uncomplicated: Secondary | ICD-10-CM

## 2015-09-18 DIAGNOSIS — G6282 Radiation-induced polyneuropathy: Secondary | ICD-10-CM | POA: Diagnosis not present

## 2015-09-18 DIAGNOSIS — E034 Atrophy of thyroid (acquired): Secondary | ICD-10-CM | POA: Diagnosis not present

## 2015-09-18 DIAGNOSIS — C50911 Malignant neoplasm of unspecified site of right female breast: Secondary | ICD-10-CM

## 2015-09-18 DIAGNOSIS — D0511 Intraductal carcinoma in situ of right breast: Secondary | ICD-10-CM | POA: Insufficient documentation

## 2015-09-18 DIAGNOSIS — R69 Illness, unspecified: Secondary | ICD-10-CM | POA: Diagnosis not present

## 2015-09-18 LAB — POCT URINALYSIS DIPSTICK
BILIRUBIN UA: NEGATIVE
Glucose, UA: NEGATIVE
KETONES UA: NEGATIVE
Leukocytes, UA: NEGATIVE
Nitrite, UA: NEGATIVE
PH UA: 6.5
PROTEIN UA: NEGATIVE
RBC UA: NEGATIVE
SPEC GRAV UA: 1.01
Urobilinogen, UA: 0.2

## 2015-09-18 NOTE — Patient Instructions (Signed)
Health Maintenance  Topic Date Due  . Hepatitis C Screening  [birth year]  . DEXA SCAN  09/06/2011  . INFLUENZA VACCINE  11/28/2015  . PNA vac Low Risk Adult (2 of 2 - PPSV23) 01/12/2016  . MAMMOGRAM  01/29/2017  . TETANUS/TDAP  05/01/2019  . COLONOSCOPY  06/28/2023  . ZOSTAVAX  Completed   DASH Eating Plan DASH stands for "Dietary Approaches to Stop Hypertension." The DASH eating plan is a healthy eating plan that has been shown to reduce high blood pressure (hypertension). Additional health benefits may include reducing the risk of type 2 diabetes mellitus, heart disease, and stroke. The DASH eating plan may also help with weight loss. WHAT DO I NEED TO KNOW ABOUT THE DASH EATING PLAN? For the DASH eating plan, you will follow these general guidelines:  Choose foods with a percent daily value for sodium of less than 5% (as listed on the food label).  Use salt-free seasonings or herbs instead of table salt or sea salt.  Check with your health care provider or pharmacist before using salt substitutes.  Eat lower-sodium products, often labeled as "lower sodium" or "no salt added."  Eat fresh foods.  Eat more vegetables, fruits, and low-fat dairy products.  Choose whole grains. Look for the word "whole" as the first word in the ingredient list.  Choose fish and skinless chicken or [place] more often than red meat. Limit fish, poultry, and meat to 6 oz (170 g) each day.  Limit sweets, desserts, sugars, and sugary drinks.  Choose heart-healthy fats.  Limit cheese to 1 oz (28 g) per day.  Eat more home-cooked food and less restaurant, buffet, and fast food.  Limit fried foods.  Cook foods using methods other than frying.  Limit canned vegetables. If you do use them, rinse them well to decrease the sodium.  When eating at a restaurant, ask that your food be prepared with less salt, or no salt if possible. WHAT FOODS CAN I EAT? Seek help from a dietitian for individual calorie  needs. Grains Whole grain or whole wheat bread. Brown rice. Whole grain or whole wheat pasta. Quinoa, bulgur, and whole grain cereals. Low-sodium cereals. Corn or whole wheat flour tortillas. Whole grain cornbread. Whole grain crackers. Low-sodium crackers. Vegetables Fresh or frozen vegetables (raw, steamed, roasted, or grilled). Low-sodium or reduced-sodium tomato and vegetable juices. Low-sodium or reduced-sodium tomato sauce and paste. Low-sodium or reduced-sodium canned vegetables.  Fruits All fresh, canned (in natural juice), or frozen fruits. Meat and Other Protein Products Ground beef (85% or leaner), grass-fed beef, or beef trimmed of fat. Skinless chicken or [place]. Ground chicken or [place]. Pork trimmed of fat. All fish and seafood. Eggs. Dried beans, peas, or lentils. Unsalted nuts and seeds. Unsalted canned beans. Dairy Low-fat dairy products, such as skim or 1% milk, 2% or reduced-fat cheeses, low-fat ricotta or cottage cheese, or plain low-fat yogurt. Low-sodium or reduced-sodium cheeses. Fats and Oils Tub margarines without trans fats. Light or reduced-fat mayonnaise and salad dressings (reduced sodium). Avocado. Safflower, olive, or canola oils. Natural peanut or almond butter. Other Unsalted popcorn and pretzels. The items listed above may not be a complete list of recommended foods or beverages. Contact your dietitian for more options. WHAT FOODS ARE NOT RECOMMENDED? Grains White bread. White pasta. White rice. Refined cornbread. Bagels and croissants. Crackers that contain trans fat. Vegetables Creamed or fried vegetables. Vegetables in a cheese sauce. Regular canned vegetables. Regular canned tomato sauce and paste. Regular tomato and vegetable juices.  Fruits Dried fruits. Canned fruit in light or heavy syrup. Fruit juice. Meat and Other Protein Products Fatty cuts of meat. Ribs, chicken wings, bacon, sausage, bologna, salami, chitterlings, fatback, hot dogs, bratwurst,  and packaged luncheon meats. Salted nuts and seeds. Canned beans with salt. Dairy Whole or 2% milk, cream, half-and-half, and cream cheese. Whole-fat or sweetened yogurt. Full-fat cheeses or blue cheese. Nondairy creamers and whipped toppings. Processed cheese, cheese spreads, or cheese curds. Condiments Onion and garlic salt, seasoned salt, table salt, and sea salt. Canned and packaged gravies. Worcestershire sauce. Tartar sauce. Barbecue sauce. Teriyaki sauce. Soy sauce, including reduced sodium. Steak sauce. Fish sauce. Oyster sauce. Cocktail sauce. Horseradish. Ketchup and mustard. Meat flavorings and tenderizers. Bouillon cubes. Hot sauce. Tabasco sauce. Marinades. Taco seasonings. Relishes. Fats and Oils Butter, stick margarine, lard, shortening, ghee, and bacon fat. Coconut, palm kernel, or palm oils. Regular salad dressings. Other Pickles and olives. Salted popcorn and pretzels. The items listed above may not be a complete list of foods and beverages to avoid. Contact your dietitian for more information. WHERE CAN I FIND MORE INFORMATION? National Heart, Lung, and Blood Institute: travelstabloid.com   This information is not intended to replace advice given to you by your health care provider. Make sure you discuss any questions you have with your health care provider.   Document Released: 04/04/2011 Document Revised: 05/06/2014 Document Reviewed: 02/17/2013 Elsevier Interactive Patient Education [employer].

## 2015-09-18 NOTE — Progress Notes (Signed)
Patient: Cheyenne Cummings, Female    DOB: March 31, 1946, 70 y.o.   MRN: [id number] Visit Date: 09/18/2015  Today's Provider: Halina Maidens, MD   Chief Complaint  Patient presents with  . Medicare Wellness  . Hypertension  . Hypothyroidism  . Hyperlipidemia  . Nicotine Dependence    Nurse provided information [facility]   Subjective:    Annual wellness visit Cheyenne Cummings is a 70 y.o. female who presents today for her Subsequent Annual Wellness Visit. She feels fairly well. She reports exercising by walking. She reports she is sleeping well. She is due for a dx mammogram this fall.  ----------------------------------------------------------- Hypertension This is a chronic problem. The current episode started more than 1 year ago. The problem is controlled. Pertinent negatives include no chest pain, headaches, palpitations or shortness of breath. Past treatments include angiotensin blockers. Hypertensive end-organ damage includes a thyroid problem.  Hyperlipidemia This is a chronic problem. The current episode started more than 1 year ago. The problem is controlled. Recent lipid tests were reviewed and are normal. Pertinent negatives include no chest pain, focal weakness, leg pain, myalgias or shortness of breath. Current antihyperlipidemic treatment includes statins.  Thyroid Problem Presents for follow-up visit. Patient reports no anxiety, constipation, diarrhea, fatigue, palpitations or tremors. The symptoms have been stable. Past treatments include levothyroxine. Her past medical history is significant for hyperlipidemia.  Breast Cancer - followed by cancer center.  Last Mammogram in October was released for a year.  She denies any new breast mass or symptoms.  She continues on Tamoxifen.  Review of Systems  Constitutional: Negative for fever, chills and fatigue.  HENT: Positive for hearing loss. Negative for congestion, tinnitus, trouble swallowing and voice change.   Eyes: Negative for  visual disturbance.  Respiratory: Negative for cough, chest tightness, shortness of breath and wheezing.   Cardiovascular: Negative for chest pain, palpitations and leg swelling.  Gastrointestinal: Negative for vomiting, abdominal pain, diarrhea and constipation.  Endocrine: Negative for polydipsia and polyuria.  Genitourinary: Negative for dysuria, frequency, vaginal bleeding, vaginal discharge and genital sores.  Musculoskeletal: Negative for myalgias, joint swelling, arthralgias and gait problem.  Skin: Negative for color change and rash.  Neurological: Positive for numbness (in feet). Negative for dizziness, tremors, focal weakness, light-headedness and headaches.  Hematological: Negative for adenopathy. Does not bruise/bleed easily.  Psychiatric/Behavioral: Negative for sleep disturbance and dysphoric mood. The patient is not nervous/anxious.     Social History   Social History  . Marital Status: Married    Spouse Name: N/A  . Number of Children: N/A  . Years of Education: N/A   Occupational History  . Not on file.   Social History Main Topics  . Smoking status: Current Every Day Smoker -- 0.50 packs/day for 30 years    Types: Cigarettes  . Smokeless tobacco: Current User    Types: Chew  . Alcohol Use: 0.0 oz/week    0 Standard drinks or equivalent per week     Comment: wine  . Drug Use: No  . Sexual Activity: Not on file   Other Topics Concern  . Not on file   Social History Narrative    Patient Active Problem List   Diagnosis Date Noted  . Malignant neoplasm of right female breast ([place]) 09/18/2015  . Alphaherpesviral disease 10/01/2014  . Hypothyroid 10/01/2014  . Hyperlipidemia 10/01/2014  . Anxiety disorder due to known physiological condition 10/01/2014  . Tobacco use disorder, mild, in sustained remission 10/01/2014  . Environmental and seasonal  allergies 10/01/2014  . BB block 10/01/2014  . Edema extremities 10/01/2014  . Essential (primary)  hypertension 10/01/2014  . History of colon polyps 10/01/2014  . Family history of aneurysm 10/01/2014  . Arthritis of knee, degenerative 10/01/2014  . Addison anemia 10/01/2014  . Leg pain 02/11/2014  . Leg paresthesia 02/11/2014  . Plantar fasciitis 02/11/2014  . Neuropathy due to ionizing radiation [facility]) 02/11/2014    Past Surgical History  Procedure Laterality Date  . Breast lumpectomy Right 2014    DCIS - XRT and Chemo  . Tonsillectomy    . Total hip arthroplasty  2010  . Arthroscopy with anterior cruciate ligament (acl) repair with anterior tibilias graft Bilateral 1996, 2003  . Tympanoplasty    . Bunionectomy Right 2015  . Breast lumpectomy Right     Her family history includes Breast cancer (age of onset: 60) in her other; Cancer in her maternal grandmother.    Previous Medications   ACYCLOVIR (ZOVIRAX) 200 MG CAPSULE    Take 1 capsule by mouth 3 (three) times daily as needed (for fever blisters).    CHOLECALCIFEROL (VITAMIN D) 1000 UNITS TABLET    Take 1,000 Units by mouth daily.   CLONAZEPAM (KLONOPIN) 0.5 MG TABLET    Take 0.5 mg by mouth 2 (two) times daily.   CYANOCOBALAMIN 1000 MCG TABLET    Take 1 tablet by mouth daily.   DIAZEPAM (VALIUM) 5 MG TABLET    Take 1 tablet (5 mg total) by mouth every 12 (twelve) hours as needed for anxiety.   FEXOFENADINE (ALLEGRA) 180 MG TABLET    Take 1 tablet by mouth daily.   LEVOTHYROXINE (SYNTHROID, LEVOTHROID) 75 MCG TABLET    Take 1 tablet (75 mcg total) by mouth daily.   LOSARTAN (COZAAR) 50 MG TABLET    Take 1 tablet (50 mg total) by mouth daily.   LOVASTATIN (MEVACOR) 40 MG TABLET    Take 1 tablet (40 mg total) by mouth at bedtime.   MULTIPLE VITAMINS-MINERALS (MULTIVITAMIN WITH MINERALS) TABLET    Take 1 tablet by mouth daily.   NAPROXEN SODIUM 220 MG CAPS    Take 1 capsule by mouth 2 (two) times daily as needed.   TAMOXIFEN (NOLVADEX) 20 MG TABLET    Take 1 tablet (20 mg total) by mouth daily.   VENLAFAXINE XR (EFFEXOR-XR)  75 MG 24 HR CAPSULE    Take 1 capsule (75 mg total) by mouth daily.    Patient Care Team: Glean Hess, MD as PCP - General (Family Medicine)     Objective:   Vitals: BP 108/78 mmHg  Pulse 88  Resp 16  Ht 5\' 5"  (1.651 m)  Wt 138 lb (62.596 kg)  BMI 22.96 kg/m2  SpO2 98%  Physical Exam  Constitutional: She is oriented to person, place, and time. She appears well-developed and well-nourished. No distress.  HENT:  Head: Normocephalic and atraumatic.  Right Ear: Tympanic membrane and ear canal normal.  Left Ear: Tympanic membrane and ear canal normal.  Nose: Right sinus exhibits no maxillary sinus tenderness. Left sinus exhibits no maxillary sinus tenderness.  Mouth/Throat: Uvula is midline and oropharynx is clear and moist.  Eyes: Conjunctivae and EOM are normal. Right eye exhibits no discharge. Left eye exhibits no discharge. No scleral icterus.  Neck: Normal range of motion. Carotid bruit is not present. No erythema present. No thyromegaly present.  Cardiovascular: Normal rate, regular rhythm, normal heart sounds and normal pulses.   Pulmonary/Chest: Effort normal and breath  sounds normal. No respiratory distress. She has no wheezes. Right breast exhibits mass (at 7 oclock) and skin change (inferior -lateral lumpectomy scar). Right breast exhibits no nipple discharge and no tenderness. Left breast exhibits no mass, no nipple discharge, no skin change and no tenderness.  Abdominal: Soft. Bowel sounds are normal. There is no hepatosplenomegaly. There is no tenderness. There is no CVA tenderness.  Musculoskeletal: Normal range of motion.  Lymphadenopathy:    She has no cervical adenopathy.    She has no axillary adenopathy.  Neurological: She is alert and oriented to person, place, and time. She has normal reflexes. No cranial nerve deficit or sensory deficit.  Skin: Skin is warm, dry and intact. No rash noted.  Psychiatric: She has a normal mood and affect. Her speech is normal  and behavior is normal. Thought content normal.  Nursing note and vitals reviewed.   Activities of Daily Living In your present state of health, do you have any difficulty performing the following activities: 09/18/2015 01/12/2015  Hearing? N Y  Vision? N N  Difficulty concentrating or making decisions? N N  Walking or climbing stairs? N N  Dressing or bathing? N N  Doing errands, shopping? N N  Preparing Food and eating ? N -  Using the Toilet? N -  In the past six months, have you accidently leaked urine? N -  Do you have problems with loss of bowel control? N -  Managing your Medications? N -  Managing your Finances? N -  Housekeeping or managing your Housekeeping? N -    Fall Risk Assessment Fall Risk  09/18/2015 01/06/2015  Falls in the past year? No No      Depression Screen PHQ 2/9 Scores 09/18/2015 01/06/2015  PHQ - 2 Score 0 0    Cognitive Testing - 6-CIT   Correct? Score   What year is it? yes 0 Yes = 0    No = 4  What month is it? yes 0 Yes = 0    No = 3  Remember:     [address]ia [address], [place], [place]     What time is it? yes 0 Yes = 0    No = 3  Count backwards from 20 to 1 yes 0 Correct = 0    1 error = 2   More than 1 error = 4  Say the months of the year in reverse. yes 0 Correct = 0    1 error = 2   More than 1 error = 4  What address did I ask you to remember? yes 0 Correct = 0  1 error = 2    2 error = 4    3 error = 6    4 error = 8    All wrong = 10       TOTAL SCORE  0/28   Interpretation:  Normal  Normal (0-7) Abnormal (8-28)        Medicare Annual Wellness Visit Summary:  Reviewed patient's Family Medical History Reviewed and updated list of patient's medical providers Assessment of cognitive impairment was done Assessed patient's functional ability Established a written schedule for health screening [place] Completed and Reviewed  Exercise Activities and Dietary recommendations Goals    None      Immunization  History  Administered Date(s) Administered  . Influenza,inj,Quad PF,36+ Mos 01/12/2015  . Pneumococcal Conjugate-13 01/12/2015  . Pneumococcal Polysaccharide-23 04/30/2009  . Tdap 04/30/2009  .  Zoster 04/30/2010    Health Maintenance  Topic Date Due  . Hepatitis C Screening  10-02-1945  . DEXA SCAN  09/06/2011  . INFLUENZA VACCINE  11/28/2015  . PNA vac Low Risk Adult (2 of 2 - PPSV23) 01/12/2016  . MAMMOGRAM  01/29/2017  . TETANUS/TDAP  05/01/2019  . COLONOSCOPY  06/28/2023  . ZOSTAVAX  Completed     Discussed health benefits of physical activity, and encouraged her to engage in regular exercise appropriate for her age and condition.    ------------------------------------------------------------------------------------------------------------   Assessment & Plan:  1. Medicare annual wellness visit, subsequent Measures satisfied DEXA ordered Vaccinations up to date  2. Essential (primary) hypertension controlled - CBC with Differential/Platelet - POCT urinalysis dipstick  3. Neuropathy due to ionizing radiation (HCC) stable - Comprehensive metabolic panel  4. Hyperlipidemia On statin therapy - Lipid panel  5. Tobacco use disorder - Nurse to provide smoking / tobacco cessation education  6. Malignant neoplasm of right female breast, unspecified site of breast ([place]) New mass vs scar tissue - MM Digital Diagnostic Unilat R; Future - US BREAST LTD UNI RIGHT INC AXILLA; Future  7. Hypothyroidism due to acquired atrophy of thyroid supplemented - TSH   Halina Maidens, MD [facility]  09/18/2015

## 2015-09-19 LAB — COMPREHENSIVE METABOLIC PANEL
ALBUMIN: 4.4 g/dL (ref 3.6–4.8)
ALK PHOS: 57 IU/L (ref 39–117)
ALT: 23 IU/L (ref 0–32)
AST: 28 IU/L (ref 0–40)
Albumin/Globulin Ratio: 1.7 (ref 1.2–2.2)
BILIRUBIN TOTAL: 0.3 mg/dL (ref 0.0–1.2)
BUN / CREAT RATIO: 15 (ref 12–28)
BUN: 10 mg/dL (ref 8–27)
CHLORIDE: 98 mmol/L (ref 96–106)
CO2: 23 mmol/L (ref 18–29)
Calcium: 9.4 mg/dL (ref 8.7–10.3)
Creatinine, Ser: 0.66 mg/dL (ref 0.57–1.00)
GFR calc Af Amer: 104 mL/min/{1.73_m2} (ref >59)
GFR calc non Af Amer: 90 mL/min/{1.73_m2} (ref >59)
GLUCOSE: 82 mg/dL (ref 65–99)
Globulin, Total: 2.6 g/dL (ref 1.5–4.5)
Potassium: 4.2 mmol/L (ref 3.5–5.2)
SODIUM: 140 mmol/L (ref 134–144)
Total Protein: 7 g/dL (ref 6.0–8.5)

## 2015-09-19 LAB — CBC WITH DIFFERENTIAL/PLATELET
BASOS: 0 %
Basophils Absolute: 0 10*3/uL (ref 0.0–0.2)
EOS (ABSOLUTE): 0.1 10*3/uL (ref 0.0–0.4)
EOS: 2 %
HEMOGLOBIN: 13.5 g/dL (ref 11.1–15.9)
Hematocrit: 41.3 % (ref 34.0–46.6)
Immature Grans (Abs): 0 10*3/uL (ref 0.0–0.1)
Immature Granulocytes: 0 %
LYMPHS ABS: 1 10*3/uL (ref 0.7–3.1)
Lymphs: 19 %
MCH: 32 pg (ref 26.6–33.0)
MCHC: 32.7 g/dL (ref 31.5–35.7)
MCV: 98 fL — ABNORMAL HIGH (ref 79–97)
MONOCYTES: 8 %
Monocytes Absolute: 0.4 10*3/uL (ref 0.1–0.9)
Neutrophils Absolute: 3.4 10*3/uL (ref 1.4–7.0)
Neutrophils: 71 %
PLATELETS: 247 10*3/uL (ref 150–379)
RBC: 4.22 x10E6/uL (ref 3.77–5.28)
RDW: 14.3 % (ref 12.3–15.4)
WBC: 4.9 10*3/uL (ref 3.4–10.8)

## 2015-09-19 LAB — LIPID PANEL
CHOLESTEROL TOTAL: 178 mg/dL (ref 100–199)
Chol/HDL Ratio: 2.8 ratio units (ref 0.0–4.4)
HDL: 63 mg/dL (ref >39)
LDL Calculated: 77 mg/dL (ref 0–99)
Triglycerides: 189 mg/dL — ABNORMAL HIGH (ref 0–149)
VLDL CHOLESTEROL CAL: 38 mg/dL (ref 5–40)

## 2015-09-19 LAB — TSH: TSH: 1.75 u[IU]/mL (ref 0.450–4.500)

## 2015-09-20 ENCOUNTER — Ambulatory Visit
Admission: RE | Admit: 2015-09-20 | Discharge: 2015-09-20 | Disposition: A | Discharge status: Home / Self Care | Payer: Medicare HMO | Source: Ambulatory Visit | Attending: Internal Medicine | Admitting: Internal Medicine

## 2015-09-20 DIAGNOSIS — M85851 Other specified disorders of bone density and structure, right thigh: Secondary | ICD-10-CM | POA: Diagnosis not present

## 2015-09-20 DIAGNOSIS — Z13820 Encounter for screening for osteoporosis: Secondary | ICD-10-CM

## 2015-09-20 DIAGNOSIS — Z780 Asymptomatic menopausal state: Secondary | ICD-10-CM | POA: Diagnosis not present

## 2015-09-22 ENCOUNTER — Ambulatory Visit
Admission: RE | Admit: 2015-09-22 | Discharge: 2015-09-22 | Disposition: A | Discharge status: Home / Self Care | Payer: Medicare HMO | Source: Ambulatory Visit | Attending: Internal Medicine | Admitting: Internal Medicine

## 2015-09-22 ENCOUNTER — Other Ambulatory Visit: Payer: Self-pay | Admitting: Internal Medicine

## 2015-09-22 DIAGNOSIS — C50911 Malignant neoplasm of unspecified site of right female breast: Secondary | ICD-10-CM | POA: Diagnosis not present

## 2015-09-22 DIAGNOSIS — N63 Unspecified lump in breast: Secondary | ICD-10-CM | POA: Diagnosis not present

## 2015-10-05 ENCOUNTER — Other Ambulatory Visit: Payer: Medicare Other

## 2015-10-05 ENCOUNTER — Ambulatory Visit: Payer: Medicare Other | Admitting: Internal Medicine

## 2015-10-06 ENCOUNTER — Other Ambulatory Visit: Payer: Medicare Other

## 2015-10-06 ENCOUNTER — Ambulatory Visit: Payer: Medicare Other | Admitting: Oncology

## 2015-10-12 ENCOUNTER — Encounter: Payer: Self-pay | Admitting: Internal Medicine

## 2015-10-13 ENCOUNTER — Other Ambulatory Visit: Payer: Self-pay | Admitting: Internal Medicine

## 2015-10-13 DIAGNOSIS — R928 Other abnormal and inconclusive findings on diagnostic imaging of breast: Secondary | ICD-10-CM

## 2015-10-17 ENCOUNTER — Encounter: Payer: Self-pay | Admitting: Internal Medicine

## 2015-10-18 ENCOUNTER — Encounter: Payer: Self-pay | Admitting: Internal Medicine

## 2015-10-20 ENCOUNTER — Ambulatory Visit: Payer: Medicare Other | Admitting: Oncology

## 2015-10-20 ENCOUNTER — Other Ambulatory Visit: Payer: Medicare Other

## 2015-10-20 NOTE — Telephone Encounter (Signed)
Patient left vm wanting info on vaccines and HepC/ Left 2 messages no answer

## 2015-10-26 ENCOUNTER — Other Ambulatory Visit: Payer: Self-pay | Admitting: Internal Medicine

## 2015-10-26 ENCOUNTER — Telehealth: Payer: Self-pay

## 2015-10-26 MED ORDER — TAMOXIFEN CITRATE 20 MG PO TABS: 20.0000 mg | ORAL_TABLET | Freq: Every day | ORAL | Status: DC

## 2015-10-26 MED ORDER — LEVOTHYROXINE SODIUM 75 MCG PO TABS: 75.0000 ug | ORAL_TABLET | Freq: Every day | ORAL | Status: DC

## 2015-10-26 MED ORDER — LOVASTATIN 40 MG PO TABS: 40.0000 mg | ORAL_TABLET | Freq: Every day | ORAL | Status: DC

## 2015-10-26 MED ORDER — VENLAFAXINE HCL ER 75 MG PO CP24: 75.0000 mg | ORAL_CAPSULE | Freq: Every day | ORAL | Status: DC

## 2015-10-26 MED ORDER — LOSARTAN POTASSIUM 50 MG PO TABS: 50.0000 mg | ORAL_TABLET | Freq: Every day | ORAL | Status: DC

## 2015-10-26 NOTE — Telephone Encounter (Signed)
Patient said all her meds are not on MYchart and she needs refills on Potassium, Lovastatin, Tamoxifen, Losartan, and Levothyroxine.

## 2015-10-27 ENCOUNTER — Ambulatory Visit
Admission: RE | Admit: 2015-10-27 | Discharge: 2015-10-27 | Disposition: A | Discharge status: Home / Self Care | Payer: Medicare HMO | Source: Ambulatory Visit | Attending: Internal Medicine | Admitting: Internal Medicine

## 2015-10-27 ENCOUNTER — Ambulatory Visit: Payer: Medicare HMO

## 2015-10-27 DIAGNOSIS — Z923 Personal history of irradiation: Secondary | ICD-10-CM | POA: Diagnosis not present

## 2015-10-27 DIAGNOSIS — N63 Unspecified lump in breast: Secondary | ICD-10-CM | POA: Diagnosis not present

## 2015-10-27 DIAGNOSIS — R928 Other abnormal and inconclusive findings on diagnostic imaging of breast: Secondary | ICD-10-CM

## 2015-10-27 DIAGNOSIS — Z9889 Other specified postprocedural states: Secondary | ICD-10-CM | POA: Diagnosis not present

## 2015-10-27 DIAGNOSIS — Z09 Encounter for follow-up examination after completed treatment for conditions other than malignant neoplasm: Secondary | ICD-10-CM | POA: Diagnosis not present

## 2015-10-27 HISTORY — PX: BREAST BIOPSY: SHX20

## 2015-10-30 LAB — SURGICAL PATHOLOGY

## 2015-11-01 ENCOUNTER — Other Ambulatory Visit: Payer: Self-pay | Admitting: *Deleted

## 2015-11-01 DIAGNOSIS — C50911 Malignant neoplasm of unspecified site of right female breast: Secondary | ICD-10-CM

## 2015-11-03 ENCOUNTER — Inpatient Hospital Stay: Payer: Medicare HMO

## 2015-11-03 ENCOUNTER — Inpatient Hospital Stay: Payer: Medicare HMO | Attending: Internal Medicine | Admitting: Oncology

## 2015-11-03 ENCOUNTER — Other Ambulatory Visit: Payer: Self-pay | Admitting: *Deleted

## 2015-11-03 VITALS — BP 121/79 | HR 82 | Temp 98.6°F | Resp 18 | Wt 140.0 lb

## 2015-11-03 DIAGNOSIS — F17210 Nicotine dependence, cigarettes, uncomplicated: Secondary | ICD-10-CM | POA: Diagnosis not present

## 2015-11-03 DIAGNOSIS — E039 Hypothyroidism, unspecified: Secondary | ICD-10-CM | POA: Insufficient documentation

## 2015-11-03 DIAGNOSIS — Z79899 Other long term (current) drug therapy: Secondary | ICD-10-CM | POA: Diagnosis not present

## 2015-11-03 DIAGNOSIS — Z170 Estrogen receptor positive status [ER+]: Secondary | ICD-10-CM

## 2015-11-03 DIAGNOSIS — F419 Anxiety disorder, unspecified: Secondary | ICD-10-CM | POA: Diagnosis not present

## 2015-11-03 DIAGNOSIS — M1990 Unspecified osteoarthritis, unspecified site: Secondary | ICD-10-CM | POA: Diagnosis not present

## 2015-11-03 DIAGNOSIS — D0511 Intraductal carcinoma in situ of right breast: Secondary | ICD-10-CM | POA: Diagnosis not present

## 2015-11-03 DIAGNOSIS — Z96649 Presence of unspecified artificial hip joint: Secondary | ICD-10-CM | POA: Diagnosis not present

## 2015-11-03 DIAGNOSIS — Z79810 Long term (current) use of selective estrogen receptor modulators (SERMs): Secondary | ICD-10-CM | POA: Diagnosis not present

## 2015-11-03 DIAGNOSIS — Z923 Personal history of irradiation: Secondary | ICD-10-CM | POA: Diagnosis not present

## 2015-11-03 DIAGNOSIS — E785 Hyperlipidemia, unspecified: Secondary | ICD-10-CM | POA: Insufficient documentation

## 2015-11-03 DIAGNOSIS — R69 Illness, unspecified: Secondary | ICD-10-CM | POA: Diagnosis not present

## 2015-11-03 DIAGNOSIS — C50911 Malignant neoplasm of unspecified site of right female breast: Secondary | ICD-10-CM

## 2015-11-03 LAB — COMPREHENSIVE METABOLIC PANEL
ALBUMIN: 3.8 g/dL (ref 3.5–5.0)
ALK PHOS: 44 U/L (ref 38–126)
ALT: 20 U/L (ref 14–54)
ANION GAP: 6 (ref 5–15)
AST: 23 U/L (ref 15–41)
BUN: 13 mg/dL (ref 6–20)
CO2: 26 mmol/L (ref 22–32)
Calcium: 8.8 mg/dL — ABNORMAL LOW (ref 8.9–10.3)
Chloride: 98 mmol/L — ABNORMAL LOW (ref 101–111)
Creatinine, Ser: 0.63 mg/dL (ref 0.44–1.00)
GFR calc Af Amer: >60 mL/min (ref >60)
GFR calc non Af Amer: >60 mL/min (ref >60)
GLUCOSE: 117 mg/dL — AB (ref 65–99)
POTASSIUM: 4.3 mmol/L (ref 3.5–5.1)
SODIUM: 130 mmol/L — AB (ref 135–145)
Total Bilirubin: 0.5 mg/dL (ref 0.3–1.2)
Total Protein: 6.6 g/dL (ref 6.5–8.1)

## 2015-11-03 LAB — CBC WITH DIFFERENTIAL/PLATELET
BASOS PCT: 1 %
Basophils Absolute: 0 10*3/uL (ref 0–0.1)
EOS ABS: 0.1 10*3/uL (ref 0–0.7)
Eosinophils Relative: 2 %
HCT: 37.4 % (ref 35.0–47.0)
HEMOGLOBIN: 12.8 g/dL (ref 12.0–16.0)
Lymphocytes Relative: 15 %
Lymphs Abs: 0.6 10*3/uL — ABNORMAL LOW (ref 1.0–3.6)
MCH: 32.1 pg (ref 26.0–34.0)
MCHC: 34.2 g/dL (ref 32.0–36.0)
MCV: 93.9 fL (ref 80.0–100.0)
MONOS PCT: 9 %
Monocytes Absolute: 0.4 10*3/uL (ref 0.2–0.9)
NEUTROS PCT: 73 %
Neutro Abs: 2.9 10*3/uL (ref 1.4–6.5)
Platelets: 193 10*3/uL (ref 150–440)
RBC: 3.98 MIL/uL (ref 3.80–5.20)
RDW: 13.4 % (ref 11.5–14.5)
WBC: 4 10*3/uL (ref 3.6–11.0)

## 2015-11-03 NOTE — Progress Notes (Signed)
[facility]  [phone] [phone]  ID: Janeece Agee OB: May 09, 1945  MR#: [id number]  ZCH#:[id number]  Patient Care Team: Glean Hess, MD as PCP - General (Family Medicine)  CHIEF COMPLAINT: Right breast DCIS.  INTERVAL HISTORY: Patient is a 70 year old female who previously was seen by Dr. Ma Hillock returns to clinic for her yearly follow-up. She recently had a breast biopsy on October 30, 2015 for a suspicious lesion seen on mammogram, but this returned negative for malignancy. She has tenderness at the site of her biopsy but otherwise feels well. She is tolerating tamoxifen without significant side effects. She has no neurologic complaints. She denies any recent fevers or illnesses. She has no chest pain or shortness of breath. She denies any nausea, vomiting, constipation, or diarrhea. She has no urinary complaints. Patient otherwise feels well and offers no further specific complaints.  REVIEW OF SYSTEMS:   Review of Systems  Constitutional: Negative.  Negative for fever, weight loss and malaise/fatigue.  Respiratory: Negative.   Cardiovascular: Negative.  Negative for chest pain.  Gastrointestinal: Negative.  Negative for abdominal pain.  Genitourinary: Negative.   Musculoskeletal: Negative.   Neurological: Negative.  Negative for weakness.  Psychiatric/Behavioral: Negative.  The patient is not nervous/anxious.     As per HPI. Otherwise, a complete review of systems is negatve.  PAST MEDICAL HISTORY: Past Medical History  Diagnosis Date  . Hyperlipidemia   . Hypothyroidism   . Osteoarthritis   . Anxiety disorder   . Herpes simplex   . Edema extremities   . Breast cancer ([place]) 01/27/2013    right DCIS lumpectomy, radiation     PAST SURGICAL HISTORY: Past Surgical History  Procedure Laterality Date  . Breast lumpectomy Right 2014    DCIS - XRT and Chemo  . Tonsillectomy    . Total hip arthroplasty  2010  . Arthroscopy with  anterior cruciate ligament (acl) repair with anterior tibilias graft Bilateral 1996, 2003  . Tympanoplasty    . Bunionectomy Right 2015  . Breast lumpectomy Right   . Breast biopsy Right 10/27/15    Benign - fat necrosis/post rad changes    FAMILY HISTORY Family History  Problem Relation Age of Onset  . Cancer Maternal Grandmother   . Breast cancer Other 49    niece       ADVANCED DIRECTIVES:    HEALTH MAINTENANCE: Social History  Substance Use Topics  . Smoking status: Current Every Day Smoker -- 0.50 packs/day for 30 years    Types: Cigarettes  . Smokeless tobacco: Current User    Types: Chew  . Alcohol Use: 0.0 oz/week    0 Standard drinks or equivalent per week     Comment: wine     Colonoscopy:  PAP:  Bone density:  Lipid panel:  Allergies  Allergen Reactions  . Ace Inhibitors Cough    Current Outpatient Prescriptions  Medication Sig Dispense Refill  . cholecalciferol (VITAMIN D) 1000 UNITS tablet Take 1,000 Units by mouth daily.    . clonazePAM (KLONOPIN) 0.5 MG tablet Take 0.5 mg by mouth 2 (two) times daily.    . cyanocobalamin 1000 MCG tablet Take 1 tablet by mouth daily.    . fexofenadine (ALLEGRA) 180 MG tablet Take 1 tablet by mouth daily.    Marland Kitchen levothyroxine (SYNTHROID, LEVOTHROID) 75 MCG tablet Take 1 tablet (75 mcg total) by mouth daily. 90 tablet 3  . losartan (COZAAR) 50 MG tablet Take 1 tablet (50 mg total) by mouth  daily. 90 tablet 3  . lovastatin (MEVACOR) 40 MG tablet Take 1 tablet (40 mg total) by mouth at bedtime. 90 tablet 3  . Multiple Vitamins-Minerals (MULTIVITAMIN WITH MINERALS) tablet Take 1 tablet by mouth daily.    . Naproxen Sodium 220 MG CAPS Take 1 capsule by mouth 2 (two) times daily as needed.    . tamoxifen (NOLVADEX) 20 MG tablet Take 1 tablet (20 mg total) by mouth daily. 90 tablet 3  . venlafaxine XR (EFFEXOR-XR) 75 MG 24 hr capsule Take 1 capsule (75 mg total) by mouth daily. 90 capsule 3   No current facility-administered  medications for this visit.    OBJECTIVE: Filed Vitals:   11/03/15 1053  BP: 121/79  Pulse: 82  Temp: 98.6 F (37 C)  Resp: 18     Body mass index is 23.3 kg/(m^2).    ECOG FS:0 - Asymptomatic  General: Well-developed, well-nourished, no acute distress. Eyes: Pink conjunctiva, anicteric sclera. Breasts: Bilateral breast and axilla without lumps or masses. Right breast with healing ecchymosis at biopsy site. Lungs: Clear to auscultation bilaterally. Heart: Regular rate and rhythm. No rubs, murmurs, or gallops. Abdomen: Soft, nontender, nondistended. No organomegaly noted, normoactive bowel sounds. Musculoskeletal: No edema, cyanosis, or clubbing. Neuro: Alert, answering all questions appropriately. Cranial nerves grossly intact. Skin: No rashes or petechiae noted. Psych: Normal affect.  LAB RESULTS:  Lab Results  Component Value Date   NA 130* 11/03/2015   K 4.3 11/03/2015   CL 98* 11/03/2015   CO2 26 11/03/2015   GLUCOSE 117* 11/03/2015   BUN 13 11/03/2015   CREATININE 0.63 11/03/2015   CALCIUM 8.8* 11/03/2015   PROT 6.6 11/03/2015   ALBUMIN 3.8 11/03/2015   AST 23 11/03/2015   ALT 20 11/03/2015   ALKPHOS 44 11/03/2015   BILITOT 0.5 11/03/2015   GFRNONAA >60 11/03/2015   GFRAA >60 11/03/2015    Lab Results  Component Value Date   WBC 4.0 11/03/2015   NEUTROABS 2.9 11/03/2015   HGB 12.8 11/03/2015   HCT 37.4 11/03/2015   MCV 93.9 11/03/2015   PLT 193 11/03/2015     STUDIES: Mm Clip Placement Right  10/27/2015  CLINICAL DATA:  Post ultrasound-guided core needle biopsy of right breast 830 o'clock scarring versus mass. EXAM: DIAGNOSTIC RIGHT MAMMOGRAM POST ULTRASOUND BIOPSY COMPARISON:  Previous exam(s). FINDINGS: Mammographic images were obtained following ultrasound guided biopsy of right breast 830 o'clock mass. Two-view mammography demonstrates present week shaped 1 biopsy site. Expected post biopsy changes are seen. IMPRESSION: Successful placement of  tissue marker, post ultrasound-guided core needle biopsy of right breast 830 o'clock mass. Final Assessment: Post Procedure Mammograms for Marker Placement Electronically Signed   By: Ted Mcalpine M.D.   On: 10/27/2015 09:26   [place] Rt Breast Bx W Loc Dev 1st Lesion Img Bx Spec US Guide  10/30/2015  ADDENDUM REPORT: 10/30/2015 15:14 ADDENDUM: Pathology of the right breast biopsy revealed BREAST, RIGHT 8:30; ULTRASOUND GUIDED BIOPSY: NEGATIVE FOR ATYPIA AND MALIGNANCY. FAT NECROSIS AND CHANGES CONSISTENT WITH HISTORY OF RADIATION. This was found to be concordant by Dr. Carmela Rima. Recommendations: Six month follow-up diagnostic mammogram of the right breast. At the patient's request, pathology and recommendations were relayed to the patient by phone. She stated she has done well following the biopsy with no bleeding, bruising, or hematoma. Post biopsy instructions were reviewed with the patient. All of her questions were answered. She was encouraged to call the [facility] of [facility] or [place],  [facility]) with any further questions or concerns. Pathology and recommendations relayed by Jetta Lout, RRA on 10/30/15 at 3:00 PM. Electronically Signed   By: Fidela Salisbury M.D.   On: 10/30/2015 15:14  10/30/2015  CLINICAL DATA:  Mass versus scarring in the right 830 o'clock breast, at the site of prior lumpectomy. EXAM: ULTRASOUND GUIDED RIGHT BREAST CORE NEEDLE BIOPSY COMPARISON:  Previous exam(s). FINDINGS: I met with the patient and we discussed the procedure of ultrasound-guided biopsy, including benefits and alternatives. We discussed the high likelihood of a successful procedure. We discussed the risks of the procedure, including infection, bleeding, tissue injury, clip migration, and inadequate sampling. Informed written consent was given. The usual time-out protocol was performed immediately prior to the procedure. Using sterile technique and 1%  Lidocaine as local anesthetic, under direct ultrasound visualization, a 14 gauge spring-loaded device was used to perform biopsy of right breast 830 o'clock mass using a inferior approach. At the conclusion of the procedure a wing shaped tissue marker clip was deployed into the biopsy cavity. Follow up 2 view mammogram was performed and dictated separately. IMPRESSION: Ultrasound guided biopsy of right breast mass versus scarring. No apparent complications. Electronically Signed: By: Fidela Salisbury M.D. On: 10/27/2015 09:24    ASSESSMENT: Right breast DCIS:  PLAN:    1. Right breast DCIS: Patient had partial mastectomy on February 25, 2013. She subsequently completed XRT and initiated tamoxifen in January 2015. She will complete her 5 years of tamoxifen in January 2020. Patient had a recent right breast biopsy on October 30, 2015 that was negative for malignancy. Repeat bilateral mammogram in 6 months. No further intervention is needed. Patient continues to request yearly follow-up, therefore will return to clinic in July 2018 for further evaluation.  Approximately 30 minutes was spent in discussion of which greater than 50% was consultation.  Patient expressed understanding and was in agreement with this plan. She also understands that She can call clinic at any time with any questions, concerns, or complaints.    Lloyd Huger, MD   11/03/2015 1:56 PM

## 2015-11-03 NOTE — Progress Notes (Signed)
Offers no complaints  

## 2015-11-06 DIAGNOSIS — H538 Other visual disturbances: Secondary | ICD-10-CM | POA: Diagnosis not present

## 2015-12-05 DIAGNOSIS — H02831 Dermatochalasis of right upper eyelid: Secondary | ICD-10-CM | POA: Diagnosis not present

## 2015-12-14 ENCOUNTER — Telehealth: Payer: Self-pay | Admitting: *Deleted

## 2015-12-14 NOTE — Telephone Encounter (Signed)
Received referral for initial lung cancer screening scan. Contacted patient and obtained smoking history,(current smoker, with 37.5 pack year history) as well as answering questions related to screening process. Patient denies signs of lung cancer such as weight loss or hemoptysis. Patient denies comorbidity that would prevent curative treatment if lung cancer were found. Patient has not had any CT imaging of her chest in past 12 months. Patient will be scheduled for shared decision making visit and CT scan, pending insurance approval from business office.

## 2015-12-27 ENCOUNTER — Telehealth: Payer: Self-pay | Admitting: *Deleted

## 2015-12-27 NOTE — Telephone Encounter (Signed)
After multiple attempts have been able to obtain insurance authorization for lung cancer screening scan. Message left for patient to call back in order to schedule.

## 2015-12-28 ENCOUNTER — Telehealth: Payer: Self-pay | Admitting: *Deleted

## 2015-12-28 NOTE — Telephone Encounter (Signed)
Patient returned call and is scheduled for low dose lung cancer screening scan on 01/02/16 at 1:30pm.

## 2015-12-29 ENCOUNTER — Other Ambulatory Visit: Payer: Self-pay | Admitting: *Deleted

## 2015-12-29 DIAGNOSIS — Z87891 Personal history of nicotine dependence: Secondary | ICD-10-CM

## 2016-01-02 ENCOUNTER — Encounter: Payer: Self-pay | Admitting: Oncology

## 2016-01-02 ENCOUNTER — Ambulatory Visit
Admission: RE | Admit: 2016-01-02 | Discharge: 2016-01-02 | Disposition: A | Discharge status: Home / Self Care | Payer: Medicare HMO | Source: Ambulatory Visit | Attending: Oncology | Admitting: Oncology

## 2016-01-02 ENCOUNTER — Inpatient Hospital Stay: Payer: Medicare HMO | Attending: Oncology | Admitting: Oncology

## 2016-01-02 DIAGNOSIS — I712 Thoracic aortic aneurysm, without rupture: Secondary | ICD-10-CM | POA: Diagnosis not present

## 2016-01-02 DIAGNOSIS — Z87891 Personal history of nicotine dependence: Secondary | ICD-10-CM | POA: Diagnosis not present

## 2016-01-02 DIAGNOSIS — Z122 Encounter for screening for malignant neoplasm of respiratory organs: Secondary | ICD-10-CM

## 2016-01-03 DIAGNOSIS — R69 Illness, unspecified: Secondary | ICD-10-CM | POA: Diagnosis not present

## 2016-01-03 DIAGNOSIS — E785 Hyperlipidemia, unspecified: Secondary | ICD-10-CM | POA: Diagnosis not present

## 2016-01-03 DIAGNOSIS — Z01818 Encounter for other preprocedural examination: Secondary | ICD-10-CM | POA: Diagnosis not present

## 2016-01-03 DIAGNOSIS — E039 Hypothyroidism, unspecified: Secondary | ICD-10-CM | POA: Diagnosis not present

## 2016-01-03 NOTE — Progress Notes (Signed)
In accordance with CMS guidelines, patient has met eligibility criteria including age, absence of signs or symptoms of lung cancer.  Social History  Substance Use Topics  . Smoking status: Current Every Day Smoker    Packs/day: 0.75    Years: 50.00    Types: Cigarettes  . Smokeless tobacco: Current User    Types: Chew  . Alcohol use 0.0 oz/week     Comment: wine     A shared decision-making session was conducted prior to the performance of CT scan. This includes one or more decision aids, includes benefits and harms of screening, follow-up diagnostic testing, over-diagnosis, false positive rate, and total radiation exposure.  Counseling on the importance of adherence to annual lung cancer LDCT screening, impact of co-morbidities, and ability or willingness to undergo diagnosis and treatment is imperative for compliance of the program.  Counseling on the importance of continued smoking cessation for former smokers; the importance of smoking cessation for current smokers, and information about tobacco cessation interventions have been given to patient including [place] and [employer] quit [place] programs.  Written order for lung cancer screening with LDCT has been given to the patient and any and all questions have been answered to the best of my abilities.   Yearly follow up will be coordinated by Burgess Estelle, Thoracic Navigator.

## 2016-01-04 ENCOUNTER — Telehealth: Payer: Self-pay | Admitting: *Deleted

## 2016-01-04 ENCOUNTER — Encounter: Payer: Self-pay | Admitting: Internal Medicine

## 2016-01-04 DIAGNOSIS — I7121 Aneurysm of the ascending aorta, without rupture: Secondary | ICD-10-CM | POA: Insufficient documentation

## 2016-01-04 DIAGNOSIS — I712 Thoracic aortic aneurysm, without rupture: Secondary | ICD-10-CM | POA: Insufficient documentation

## 2016-01-04 NOTE — Telephone Encounter (Signed)
Notified patient of LDCT lung cancer screening results with recommendation for 12 month follow up imaging. Also notified of incidental finding noted below. Patient verbalizes understanding. Encouraged to discuss incidental finding with PCP.  IMPRESSION: Lung-RADS Category 1, negative. Continue annual screening with low-dose chest CT without contrast in 12 months.  3.6 cm diameter ascending thoracic aorta. Recommend annual imaging followup by CTA or MRA. This recommendation follows 2010 ACCF/AHA/AATS/ACR/ASA/SCA/SCAI/SIR/STS/SVM Guidelines for the Diagnosis and Management of Patients with Thoracic Aortic Disease. Circulation.2010; 121: [id number]

## 2016-01-17 DIAGNOSIS — Z85820 Personal history of malignant melanoma of skin: Secondary | ICD-10-CM | POA: Diagnosis not present

## 2016-01-17 DIAGNOSIS — Z08 Encounter for follow-up examination after completed treatment for malignant neoplasm: Secondary | ICD-10-CM | POA: Diagnosis not present

## 2016-01-17 DIAGNOSIS — Z872 Personal history of diseases of the skin and subcutaneous tissue: Secondary | ICD-10-CM | POA: Diagnosis not present

## 2016-01-17 DIAGNOSIS — Z1283 Encounter for screening for malignant neoplasm of skin: Secondary | ICD-10-CM | POA: Diagnosis not present

## 2016-01-17 DIAGNOSIS — L818 Other specified disorders of pigmentation: Secondary | ICD-10-CM | POA: Diagnosis not present

## 2016-01-17 DIAGNOSIS — L570 Actinic keratosis: Secondary | ICD-10-CM | POA: Diagnosis not present

## 2016-01-17 DIAGNOSIS — L728 Other follicular cysts of the skin and subcutaneous tissue: Secondary | ICD-10-CM | POA: Diagnosis not present

## 2016-01-26 DIAGNOSIS — Z79899 Other long term (current) drug therapy: Secondary | ICD-10-CM | POA: Diagnosis not present

## 2016-01-26 DIAGNOSIS — E785 Hyperlipidemia, unspecified: Secondary | ICD-10-CM | POA: Diagnosis not present

## 2016-01-26 DIAGNOSIS — Z888 Allergy status to other drugs, medicaments and biological substances status: Secondary | ICD-10-CM | POA: Diagnosis not present

## 2016-01-26 DIAGNOSIS — I712 Thoracic aortic aneurysm, without rupture: Secondary | ICD-10-CM | POA: Diagnosis not present

## 2016-01-26 DIAGNOSIS — R69 Illness, unspecified: Secondary | ICD-10-CM | POA: Diagnosis not present

## 2016-01-26 DIAGNOSIS — E039 Hypothyroidism, unspecified: Secondary | ICD-10-CM | POA: Diagnosis not present

## 2016-01-26 DIAGNOSIS — H02834 Dermatochalasis of left upper eyelid: Secondary | ICD-10-CM | POA: Diagnosis not present

## 2016-01-26 DIAGNOSIS — F17200 Nicotine dependence, unspecified, uncomplicated: Secondary | ICD-10-CM | POA: Diagnosis not present

## 2016-01-26 DIAGNOSIS — H02401 Unspecified ptosis of right eyelid: Secondary | ICD-10-CM | POA: Diagnosis not present

## 2016-02-05 ENCOUNTER — Other Ambulatory Visit: Payer: Self-pay | Admitting: Oncology

## 2016-02-05 ENCOUNTER — Ambulatory Visit
Admission: RE | Admit: 2016-02-05 | Discharge: 2016-02-05 | Disposition: A | Discharge status: Home / Self Care | Payer: Medicare HMO | Source: Ambulatory Visit | Attending: Oncology | Admitting: Oncology

## 2016-02-05 DIAGNOSIS — C50911 Malignant neoplasm of unspecified site of right female breast: Secondary | ICD-10-CM

## 2016-02-05 DIAGNOSIS — R928 Other abnormal and inconclusive findings on diagnostic imaging of breast: Secondary | ICD-10-CM | POA: Diagnosis not present

## 2016-03-06 ENCOUNTER — Ambulatory Visit (INDEPENDENT_AMBULATORY_CARE_PROVIDER_SITE_OTHER): Payer: Medicare HMO

## 2016-03-06 DIAGNOSIS — Z23 Encounter for immunization: Secondary | ICD-10-CM

## 2016-04-10 DIAGNOSIS — Z87898 Personal history of other specified conditions: Secondary | ICD-10-CM | POA: Diagnosis not present

## 2016-04-10 DIAGNOSIS — G629 Polyneuropathy, unspecified: Secondary | ICD-10-CM | POA: Diagnosis not present

## 2016-04-29 HISTORY — PX: FOOT SURGERY: SHX648

## 2016-05-01 ENCOUNTER — Ambulatory Visit (INDEPENDENT_AMBULATORY_CARE_PROVIDER_SITE_OTHER): Payer: Medicare HMO | Admitting: Internal Medicine

## 2016-05-01 ENCOUNTER — Encounter: Payer: Self-pay | Admitting: Internal Medicine

## 2016-05-01 VITALS — BP 138/88 | HR 86 | Temp 98.6°F | Ht 65.0 in | Wt 142.0 lb

## 2016-05-01 DIAGNOSIS — J0100 Acute maxillary sinusitis, unspecified: Secondary | ICD-10-CM

## 2016-05-01 DIAGNOSIS — I712 Thoracic aortic aneurysm, without rupture: Secondary | ICD-10-CM | POA: Diagnosis not present

## 2016-05-01 DIAGNOSIS — H9311 Tinnitus, right ear: Secondary | ICD-10-CM

## 2016-05-01 DIAGNOSIS — I10 Essential (primary) hypertension: Secondary | ICD-10-CM | POA: Diagnosis not present

## 2016-05-01 DIAGNOSIS — I7121 Aneurysm of the ascending aorta, without rupture: Secondary | ICD-10-CM

## 2016-05-01 MED ORDER — AMOXICILLIN-POT CLAVULANATE 875-125 MG PO TABS: 1.0000 | ORAL_TABLET | Freq: Two times a day (BID) | ORAL | 0 refills | Status: DC

## 2016-05-01 NOTE — Progress Notes (Signed)
Date:  05/01/2016   Name:  Cheyenne Cummings   DOB:  [birth year]   MRN:  [id number]   Chief Complaint: Cough (Pt stated coughing with green mucus for 2-3 months.) Cough  This is a chronic problem. The current episode started more than 1 month ago. The problem occurs hourly. The cough is productive of purulent sputum. Associated symptoms include nasal congestion. Pertinent negatives include no chest pain, chills, ear congestion, sore throat, shortness of breath, weight loss or wheezing. Nothing aggravates the symptoms. Treatments tried: mucinex and sudafed. The treatment provided mild relief.  Sinus Problem  This is a chronic problem. The current episode started more than 1 month ago. The problem is unchanged. There has been no fever. Associated symptoms include congestion, coughing and sinus pressure. Pertinent negatives include no chills, shortness of breath or sore throat.  Tinnitus - in right ear intermittently.    Review of Systems  Constitutional: Negative for chills, fatigue, unexpected weight change and weight loss.  HENT: Positive for congestion, sinus pressure and tinnitus. Negative for sinus pain, sore throat and trouble swallowing.   Respiratory: Positive for cough. Negative for shortness of breath and wheezing.   Cardiovascular: Negative for chest pain and palpitations.  Gastrointestinal: Negative for diarrhea and nausea.    Patient Active Problem List   Diagnosis Date Noted  . Thoracic ascending aortic aneurysm ([place]) 01/04/2016  . Ductal carcinoma in situ (DCIS) of right breast 09/18/2015  . Tobacco use disorder 09/18/2015  . Alphaherpesviral disease 10/01/2014  . Hypothyroid 10/01/2014  . Hyperlipidemia 10/01/2014  . Anxiety disorder due to known physiological condition 10/01/2014  . Personal history of tobacco use, presenting hazards to health 10/01/2014  . Environmental and seasonal allergies 10/01/2014  . BB block 10/01/2014  . Edema extremities 10/01/2014  .  Essential (primary) hypertension 10/01/2014  . History of colon polyps 10/01/2014  . Family history of aneurysm 10/01/2014  . Arthritis of knee, degenerative 10/01/2014  . Addison anemia 10/01/2014  . Leg pain 02/11/2014  . Leg paresthesia 02/11/2014  . Plantar fasciitis 02/11/2014  . Neuropathy due to ionizing radiation ([place]) 02/11/2014    Prior to Admission medications   Medication Sig Start Date End Date Taking? Authorizing Provider  acyclovir (ZOVIRAX) 200 MG capsule Take by mouth. 06/13/14  Yes Historical Provider, MD  cholecalciferol (VITAMIN D) 1000 UNITS tablet Take 1,000 Units by mouth daily.   Yes Historical Provider, MD  clonazePAM (KLONOPIN) 0.5 MG tablet Take 0.5 mg by mouth 2 (two) times daily.   Yes Historical Provider, MD  cyanocobalamin 1000 MCG tablet Take 1 tablet by mouth daily.   Yes Historical Provider, MD  fexofenadine (ALLEGRA) 180 MG tablet Take 1 tablet by mouth daily.   Yes Historical Provider, MD  gabapentin (NEURONTIN) 100 MG capsule Take by mouth. 04/10/16 04/10/17 Yes Historical Provider, MD  levothyroxine (SYNTHROID, LEVOTHROID) 75 MCG tablet Take 1 tablet (75 mcg total) by mouth daily. 10/26/15  Yes Glean Hess, MD  losartan (COZAAR) 50 MG tablet Take 1 tablet (50 mg total) by mouth daily. 10/26/15  Yes Glean Hess, MD  lovastatin (MEVACOR) 40 MG tablet Take 1 tablet (40 mg total) by mouth at bedtime. 10/26/15  Yes Glean Hess, MD  Multiple Vitamins-Minerals (MULTIVITAMIN WITH MINERALS) tablet Take 1 tablet by mouth daily.   Yes Historical Provider, MD  Naproxen Sodium 220 MG CAPS Take 1 capsule by mouth 2 (two) times daily as needed.   Yes Historical Provider, MD  tamoxifen (NOLVADEX)  20 MG tablet Take 1 tablet (20 mg total) by mouth daily. 10/26/15  Yes Glean Hess, MD  venlafaxine XR (EFFEXOR-XR) 75 MG 24 hr capsule Take 1 capsule (75 mg total) by mouth daily. 10/26/15  Yes Glean Hess, MD    Allergies  Allergen Reactions  . Ace  Inhibitors Cough    Past Surgical History:  Procedure Laterality Date  . ARTHROSCOPY WITH ANTERIOR CRUCIATE LIGAMENT (ACL) REPAIR WITH ANTERIOR TIBILIAS GRAFT Bilateral 1996, 2003  . BREAST BIOPSY Right 10/27/15   Benign - fat necrosis/post rad changes  . BREAST LUMPECTOMY Right 2014   DCIS - XRT and Chemo  . BREAST LUMPECTOMY Right   . BUNIONECTOMY Right 2015  . TONSILLECTOMY    . TOTAL HIP ARTHROPLASTY  2010  . TYMPANOPLASTY      Social History  Substance Use Topics  . Smoking status: Current Every Day Smoker    Packs/day: 0.75    Years: 50.00    Types: Cigarettes  . Smokeless tobacco: Current User    Types: Chew  . Alcohol use 0.0 oz/week     Comment: wine     Medication list has been reviewed and updated.   Physical Exam  Constitutional: She is oriented to person, place, and time. She appears well-developed. No distress.  HENT:  Head: Normocephalic and atraumatic.  Right Ear: Tympanic membrane and ear canal normal.  Left Ear: Tympanic membrane and ear canal normal.  Nose: Right sinus exhibits maxillary sinus tenderness. Left sinus exhibits maxillary sinus tenderness.  Mouth/Throat: Uvula is midline.  Neck: Normal range of motion. Neck supple.  Cardiovascular: Normal rate, regular rhythm and normal heart sounds.   Pulmonary/Chest: Effort normal. No respiratory distress. She has decreased breath sounds. She has no wheezes. She has no rhonchi.  Musculoskeletal: Normal range of motion.  Lymphadenopathy:    She has no cervical adenopathy.  Neurological: She is alert and oriented to person, place, and time.  Skin: Skin is warm and dry. No rash noted.  Psychiatric: She has a normal mood and affect. Her behavior is normal. Thought content normal.  Nursing note and vitals reviewed.   BP 138/88   Pulse 86   Temp 98.6 F (37 C)   Ht 5\' 5"  (1.651 m)   Wt 142 lb (64.4 kg)   SpO2 98%   BMI 23.63 kg/m   Assessment and Plan: 1. Acute maxillary sinusitis, recurrence  not specified Continue mucinex and sudafed - amoxicillin-clavulanate (AUGMENTIN) 875-125 MG tablet; Take 1 tablet by mouth 2 (two) times daily.  Dispense: 20 tablet; Refill: 0  2. Thoracic ascending aortic aneurysm ([place]) Will need [place] this fall  3. Essential (primary) hypertension controlled  4. Tinnitus of right ear See ENT as planned   Halina Maidens, MD [facility]  05/01/2016

## 2016-05-13 DIAGNOSIS — R69 Illness, unspecified: Secondary | ICD-10-CM | POA: Diagnosis not present

## 2016-05-24 DIAGNOSIS — H93A1 Pulsatile tinnitus, right ear: Secondary | ICD-10-CM | POA: Diagnosis not present

## 2016-06-03 ENCOUNTER — Other Ambulatory Visit: Payer: Self-pay | Admitting: Internal Medicine

## 2016-06-03 ENCOUNTER — Telehealth: Payer: Self-pay | Admitting: Internal Medicine

## 2016-06-03 MED ORDER — ACYCLOVIR 200 MG PO CAPS: 200.0000 mg | ORAL_CAPSULE | Freq: Three times a day (TID) | ORAL | 5 refills | Status: DC

## 2016-06-03 NOTE — Telephone Encounter (Signed)
Sent Rx for three time per day as needed for fever blisters.

## 2016-06-03 NOTE — Telephone Encounter (Signed)
Pt called need refill acyclovir send to [facility]

## 2016-06-19 DIAGNOSIS — H02834 Dermatochalasis of left upper eyelid: Secondary | ICD-10-CM | POA: Diagnosis not present

## 2016-07-16 DIAGNOSIS — Z86018 Personal history of other benign neoplasm: Secondary | ICD-10-CM | POA: Diagnosis not present

## 2016-07-16 DIAGNOSIS — Z85820 Personal history of malignant melanoma of skin: Secondary | ICD-10-CM | POA: Diagnosis not present

## 2016-08-02 ENCOUNTER — Other Ambulatory Visit: Payer: Self-pay | Admitting: Internal Medicine

## 2016-08-16 ENCOUNTER — Other Ambulatory Visit: Payer: Self-pay | Admitting: Internal Medicine

## 2016-08-16 MED ORDER — LEVOTHYROXINE SODIUM 75 MCG PO TABS: 75.0000 ug | ORAL_TABLET | Freq: Every day | ORAL | 3 refills | Status: DC

## 2016-08-20 DIAGNOSIS — Z85828 Personal history of other malignant neoplasm of skin: Secondary | ICD-10-CM | POA: Diagnosis not present

## 2016-08-20 DIAGNOSIS — H9313 Tinnitus, bilateral: Secondary | ICD-10-CM | POA: Diagnosis not present

## 2016-08-20 DIAGNOSIS — Z79890 Hormone replacement therapy (postmenopausal): Secondary | ICD-10-CM | POA: Diagnosis not present

## 2016-08-20 DIAGNOSIS — E785 Hyperlipidemia, unspecified: Secondary | ICD-10-CM | POA: Diagnosis not present

## 2016-08-20 DIAGNOSIS — G629 Polyneuropathy, unspecified: Secondary | ICD-10-CM | POA: Diagnosis not present

## 2016-08-20 DIAGNOSIS — Z79810 Long term (current) use of selective estrogen receptor modulators (SERMs): Secondary | ICD-10-CM | POA: Diagnosis not present

## 2016-08-20 DIAGNOSIS — Z0000 Encounter for general adult medical examination without abnormal findings: Secondary | ICD-10-CM | POA: Diagnosis not present

## 2016-08-20 DIAGNOSIS — H259 Unspecified age-related cataract: Secondary | ICD-10-CM | POA: Diagnosis not present

## 2016-08-20 DIAGNOSIS — R69 Illness, unspecified: Secondary | ICD-10-CM | POA: Diagnosis not present

## 2016-08-20 DIAGNOSIS — H9113 Presbycusis, bilateral: Secondary | ICD-10-CM | POA: Diagnosis not present

## 2016-08-20 DIAGNOSIS — E039 Hypothyroidism, unspecified: Secondary | ICD-10-CM | POA: Diagnosis not present

## 2016-08-20 DIAGNOSIS — C50919 Malignant neoplasm of unspecified site of unspecified female breast: Secondary | ICD-10-CM | POA: Diagnosis not present

## 2016-08-20 DIAGNOSIS — Z79899 Other long term (current) drug therapy: Secondary | ICD-10-CM | POA: Diagnosis not present

## 2016-08-27 DIAGNOSIS — M79671 Pain in right foot: Secondary | ICD-10-CM | POA: Diagnosis not present

## 2016-08-29 ENCOUNTER — Other Ambulatory Visit: Payer: Self-pay | Admitting: Internal Medicine

## 2016-09-18 ENCOUNTER — Encounter: Payer: Medicare HMO | Admitting: Internal Medicine

## 2016-09-20 DIAGNOSIS — T84223D Displacement of internal fixation device of bones of foot and toes, subsequent encounter: Secondary | ICD-10-CM | POA: Diagnosis not present

## 2016-10-02 ENCOUNTER — Encounter: Payer: Self-pay | Admitting: Internal Medicine

## 2016-10-02 ENCOUNTER — Ambulatory Visit (INDEPENDENT_AMBULATORY_CARE_PROVIDER_SITE_OTHER): Payer: Medicare HMO | Admitting: Internal Medicine

## 2016-10-02 VITALS — BP 122/74 | HR 77 | Ht 65.0 in | Wt 133.0 lb

## 2016-10-02 DIAGNOSIS — I712 Thoracic aortic aneurysm, without rupture: Secondary | ICD-10-CM | POA: Diagnosis not present

## 2016-10-02 DIAGNOSIS — H02834 Dermatochalasis of left upper eyelid: Secondary | ICD-10-CM | POA: Diagnosis not present

## 2016-10-02 DIAGNOSIS — Z23 Encounter for immunization: Secondary | ICD-10-CM | POA: Diagnosis not present

## 2016-10-02 DIAGNOSIS — G6282 Radiation-induced polyneuropathy: Secondary | ICD-10-CM

## 2016-10-02 DIAGNOSIS — R69 Illness, unspecified: Secondary | ICD-10-CM | POA: Diagnosis not present

## 2016-10-02 DIAGNOSIS — I7121 Aneurysm of the ascending aorta, without rupture: Secondary | ICD-10-CM

## 2016-10-02 DIAGNOSIS — E782 Mixed hyperlipidemia: Secondary | ICD-10-CM | POA: Diagnosis not present

## 2016-10-02 DIAGNOSIS — F17200 Nicotine dependence, unspecified, uncomplicated: Secondary | ICD-10-CM | POA: Diagnosis not present

## 2016-10-02 DIAGNOSIS — E034 Atrophy of thyroid (acquired): Secondary | ICD-10-CM | POA: Diagnosis not present

## 2016-10-02 DIAGNOSIS — Z0000 Encounter for general adult medical examination without abnormal findings: Secondary | ICD-10-CM | POA: Diagnosis not present

## 2016-10-02 DIAGNOSIS — I10 Essential (primary) hypertension: Secondary | ICD-10-CM

## 2016-10-02 DIAGNOSIS — D0511 Intraductal carcinoma in situ of right breast: Secondary | ICD-10-CM

## 2016-10-02 LAB — POCT URINALYSIS DIPSTICK
BILIRUBIN UA: NEGATIVE
Glucose, UA: NEGATIVE
Ketones, UA: NEGATIVE
LEUKOCYTES UA: NEGATIVE
NITRITE UA: NEGATIVE
PH UA: 5 (ref 5.0–8.0)
PROTEIN UA: NEGATIVE
RBC UA: NEGATIVE
Spec Grav, UA: 1.01 (ref 1.010–1.025)
Urobilinogen, UA: 0.2 E.U./dL

## 2016-10-02 MED ORDER — VARENICLINE TARTRATE 0.5 MG X 11 & 1 MG X 42 PO MISC: ORAL | 0 refills | Status: DC

## 2016-10-02 MED ORDER — VARENICLINE TARTRATE 1 MG PO TABS: 1.0000 mg | ORAL_TABLET | Freq: Two times a day (BID) | ORAL | 3 refills | Status: DC

## 2016-10-02 NOTE — Progress Notes (Signed)
Patient: Cheyenne Cummings, Female    DOB: 1945/07/20, 71 y.o.   MRN: [id number] Visit Date: 10/02/2016  Today's Provider: Halina Maidens, MD   Chief Complaint  Patient presents with  . Medicare Wellness    Breast Exam.   . Hypertension  . Hypothyroidism  . Hyperlipidemia   Subjective:    Annual wellness visit Cheyenne Cummings is a 70 y.o. female who presents today for her Subsequent Annual Wellness Visit. She feels well. She reports exercising some doing yard work. She reports she is sleeping fairly well.   ----------------------------------------------------------- Hypertension  The problem is controlled. Associated symptoms include palpitations (single senstation of extra beat). Pertinent negatives include no chest pain, headaches or shortness of breath. Identifiable causes of hypertension include a thyroid problem.  Hyperlipidemia  This is a chronic problem. Pertinent negatives include no chest pain or shortness of breath. Current antihyperlipidemic treatment includes statins.  Thyroid Problem  Presents for follow-up visit. Symptoms include palpitations (single senstation of extra beat). Patient reports no anxiety, constipation, diarrhea, fatigue or tremors. The symptoms have been stable. Her past medical history is significant for hyperlipidemia.  Breast Cancer - lumpectomy in 2014 treated by Oncology with XRT and chemo.  Has another bx last year which was benign.  Has persistent chemo induced peripheral neuropathy.  Thoracic Aneurysm - 3.6 cm seen on CT screening of lungs 12/2015.  Recommend annual CTA or MRA to follow. Smoking - has tried Chantix in the past but it was too expensive.  She knows she needs to quit.   Review of Systems  Constitutional: Negative for chills, fatigue and fever.  HENT: Negative for congestion, hearing loss, tinnitus, trouble swallowing and voice change.   Eyes: Negative for visual disturbance.  Respiratory: Negative for cough, chest tightness, shortness  of breath and wheezing.   Cardiovascular: Positive for palpitations (single senstation of extra beat). Negative for chest pain and leg swelling.  Gastrointestinal: Negative for abdominal pain, constipation, diarrhea and vomiting.  Endocrine: Negative for polydipsia and polyuria.  Genitourinary: Negative for dysuria, frequency, genital sores, vaginal bleeding and vaginal discharge.  Musculoskeletal: Negative for arthralgias, gait problem and joint swelling.  Skin: Negative for color change and rash.  Neurological: Negative for dizziness, tremors, light-headedness and headaches.       Neuropathy is stable - interrupts sleep somewhat  Hematological: Negative for adenopathy. Does not bruise/bleed easily.  Psychiatric/Behavioral: Negative for dysphoric mood and sleep disturbance. The patient is not nervous/anxious.     Social History   Social History  . Marital status: Married    Spouse name: N/A  . Number of children: N/A  . Years of education: N/A   Occupational History  . Not on file.   Social History Main Topics  . Smoking status: Current Every Day Smoker    Packs/day: 0.75    Years: 50.00    Types: Cigarettes  . Smokeless tobacco: Current User    Types: Chew  . Alcohol use 0.0 oz/week     Comment: wine  . Drug use: No  . Sexual activity: Not on file   Other Topics Concern  . Not on file   Social History Narrative  . No narrative on file    Patient Active Problem List   Diagnosis Date Noted  . Hypothyroidism due to acquired atrophy of thyroid 10/02/2016  . Tinnitus of right ear 05/01/2016  . Thoracic ascending aortic aneurysm ([place]) 01/04/2016  . Ductal carcinoma in situ (DCIS) of right breast 09/18/2015  . Tobacco  use disorder 09/18/2015  . Alphaherpesviral disease 10/01/2014  . Hyperlipidemia 10/01/2014  . Anxiety disorder due to known physiological condition 10/01/2014  . Personal history of tobacco use, presenting hazards to health 10/01/2014  . Environmental  and seasonal allergies 10/01/2014  . BB block 10/01/2014  . Edema extremities 10/01/2014  . Essential (primary) hypertension 10/01/2014  . History of colon polyps 10/01/2014  . Family history of aneurysm 10/01/2014  . Arthritis of knee, degenerative 10/01/2014  . Addison anemia 10/01/2014  . Leg pain 02/11/2014  . Leg paresthesia 02/11/2014  . Plantar fasciitis 02/11/2014  . Neuropathy due to ionizing radiation ([place]) 02/11/2014    Past Surgical History:  Procedure Laterality Date  . ARTHROSCOPY WITH ANTERIOR CRUCIATE LIGAMENT (ACL) REPAIR WITH ANTERIOR TIBILIAS GRAFT Bilateral 1996, 2003  . BREAST BIOPSY Right 10/27/15   Benign - fat necrosis/post rad changes  . BREAST LUMPECTOMY Right 2014   DCIS - XRT and Chemo  . BUNIONECTOMY Right 2015  . TONSILLECTOMY    . TOTAL HIP ARTHROPLASTY  2010  . TYMPANOPLASTY      Her family history includes Breast cancer (age of onset: 75) in her other; Cancer in her maternal grandmother.     Previous Medications   ACYCLOVIR (ZOVIRAX) 200 MG CAPSULE    Take 1 capsule (200 mg total) by mouth 3 (three) times daily.   CHOLECALCIFEROL (VITAMIN D) 1000 UNITS TABLET    Take 1,000 Units by mouth daily.   CLONAZEPAM (KLONOPIN) 0.5 MG TABLET    Take 0.5 mg by mouth 2 (two) times daily.   CYANOCOBALAMIN 1000 MCG TABLET    Take 1 tablet by mouth daily.   FEXOFENADINE (ALLEGRA) 180 MG TABLET    Take 1 tablet by mouth daily.   FLUOXETINE (PROZAC) 20 MG TABLET    fluoxetine 20 mg capsule   GABAPENTIN (NEURONTIN) 100 MG CAPSULE    Take by mouth.   LEVOTHYROXINE (SYNTHROID, LEVOTHROID) 75 MCG TABLET    Take 1 tablet (75 mcg total) by mouth daily.   LOSARTAN (COZAAR) 50 MG TABLET    TAKE 1 TABLET DAILY   LOVASTATIN (MEVACOR) 40 MG TABLET    TAKE 1 TABLET AT BEDTIME   MULTIPLE VITAMINS-MINERALS (MULTIVITAMIN WITH MINERALS) TABLET    Take 1 tablet by mouth daily.   NAPROXEN SODIUM 220 MG CAPS    Take 1 capsule by mouth 2 (two) times daily as needed.   TAMOXIFEN  (NOLVADEX) 20 MG TABLET    TAKE 1 TABLET DAILY   VENLAFAXINE XR (EFFEXOR-XR) 75 MG 24 HR CAPSULE    Take 1 capsule (75 mg total) by mouth daily.    Patient Care Team: Glean Hess, MD as PCP - General (Family Medicine)      Objective:   Vitals: BP 122/74 (BP Location: Right Arm, Patient Position: Sitting, Cuff Size: Normal)   Pulse 77   Ht 5\' 5"  (1.651 m)   Wt 133 lb (60.3 kg)   SpO2 99%   BMI 22.13 kg/m   Physical Exam  Constitutional: She is oriented to person, place, and time. She appears well-developed and well-nourished. No distress.  HENT:  Head: Normocephalic and atraumatic.  Right Ear: Tympanic membrane and ear canal normal.  Left Ear: Tympanic membrane and ear canal normal.  Nose: Right sinus exhibits no maxillary sinus tenderness. Left sinus exhibits no maxillary sinus tenderness.  Mouth/Throat: Uvula is midline and oropharynx is clear and moist.  Eyes: Conjunctivae and EOM are normal. Right eye exhibits no discharge. Left  eye exhibits no discharge. No scleral icterus.  Neck: Normal range of motion. Carotid bruit is not present. No erythema present. No thyromegaly present.  Cardiovascular: Normal rate, regular rhythm, normal heart sounds and normal pulses.   Pulmonary/Chest: Effort normal and breath sounds normal. No respiratory distress. She has no wheezes. Right breast exhibits mass (at 7 oclock) and skin change (inferior -lateral lumpectomy scar). Right breast exhibits no nipple discharge and no tenderness. Left breast exhibits no mass, no nipple discharge, no skin change and no tenderness.  Abdominal: Soft. Bowel sounds are normal. There is no hepatosplenomegaly. There is no tenderness. There is no CVA tenderness.  Musculoskeletal: Normal range of motion.  Lymphadenopathy:    She has no cervical adenopathy.    She has no axillary adenopathy.  Neurological: She is alert and oriented to person, place, and time. She has normal reflexes. No cranial nerve deficit or  sensory deficit.  Skin: Skin is warm, dry and intact. No rash noted.  Psychiatric: She has a normal mood and affect. Her speech is normal and behavior is normal. Thought content normal.  Nursing note and vitals reviewed.   Activities of Daily Living In your present state of health, do you have any difficulty performing the following activities: 10/02/2016  Hearing? N  Vision? N  Difficulty concentrating or making decisions? N  Walking or climbing stairs? N  Dressing or bathing? N  Doing errands, shopping? N  Preparing Food and eating ? N  Using the Toilet? N  In the past six months, have you accidently leaked urine? Y  Do you have problems with loss of bowel control? N  Managing your Medications? N  Managing your Finances? N  Housekeeping or managing your Housekeeping? N  Some recent data might be hidden    Fall Risk Assessment Fall Risk  10/02/2016 09/18/2015 01/06/2015  Falls in the past year? No No No     Depression Screen PHQ 2/9 Scores 10/02/2016 09/18/2015 01/06/2015  PHQ - 2 Score 0 0 0   6CIT Screen 10/02/2016  What Year? 0 points  What month? 0 points  What time? 0 points  Count back from 20 0 points  Months in reverse 0 points  Repeat phrase 0 points  Total Score 0    Medicare Annual Wellness Visit Summary:  Reviewed patient's Family Medical History Reviewed and updated list of patient's medical providers Assessment of cognitive impairment was done Assessed patient's functional ability Established a written schedule for health screening [facility] Completed and Reviewed  Exercise Activities and Dietary recommendations Goals    None      Immunization History  Administered Date(s) Administered  . Influenza,inj,Quad PF,36+ Mos 01/12/2015, 03/06/2016  . Pneumococcal Conjugate-13 01/12/2015  . Pneumococcal Polysaccharide-23 04/30/2009  . Tdap 04/30/2009  . Zoster 04/30/2010    Health Maintenance  Topic Date Due  . Hepatitis C Screening   1945-06-28  . PNA vac Low Risk Adult (2 of 2 - PPSV23) 01/12/2016  . INFLUENZA VACCINE  11/27/2016  . MAMMOGRAM  02/04/2018  . TETANUS/TDAP  05/01/2019  . COLONOSCOPY  06/28/2023  . DEXA SCAN  Completed    Discussed health benefits of physical activity, and encouraged her to engage in regular exercise appropriate for her age and condition.    ------------------------------------------------------------------------------------------------------------  Assessment & Plan:   1. Medicare annual wellness visit, subsequent Measures satisfied - POCT urinalysis dipstick  2. Essential (primary) hypertension controlled - CBC with Differential/Platelet - Comprehensive metabolic panel  3. Hypothyroidism due  to acquired atrophy of thyroid supplemented - TSH  4. Mixed hyperlipidemia On statin therapy - Lipid panel  5. Thoracic ascending aortic aneurysm [facility]) Repeat CTA in September - CT ANGIO CHEST AORTA W/CM &/OR WO/CM; Future  6. Neuropathy due to ionizing radiation ([facility]) Stable, chronic  7. Need for pneumococcal vaccination - Pneumococcal polysaccharide vaccine 23-valent greater than or equal to 2yo subcutaneous/IM  8. Ductal carcinoma in situ (DCIS) of right breast Followed by Oncology Several more years of aromatase therapy  9. Tobacco use disorder Will try chantix again - encourage use for up to 6 months if needed - varenicline (CHANTIX STARTING MONTH PAK) 0.5 MG X 11 & 1 MG X 42 tablet; Take one 0.5 mg tablet by mouth once daily for 3 days, then increase to one 0.5 mg tablet twice daily for 4 days, then increase to one 1 mg tablet twice daily.  Dispense: 53 tablet; Refill: 0   Meds ordered this encounter  Medications  . varenicline (CHANTIX STARTING MONTH PAK) 0.5 MG X 11 & 1 MG X 42 tablet    Sig: Take one 0.5 mg tablet by mouth once daily for 3 days, then increase to one 0.5 mg tablet twice daily for 4 days, then increase to one 1 mg tablet twice daily.    Dispense:   53 tablet    Refill:  0  . varenicline (CHANTIX CONTINUING MONTH PAK) 1 MG tablet    Sig: Take 1 tablet (1 mg total) by mouth 2 (two) times daily.    Dispense:  60 tablet    Refill:  [place]yon, MD [facility]  10/02/2016

## 2016-10-02 NOTE — Patient Instructions (Signed)
Health Maintenance  Topic Date Due  . PNA vac Low Risk Adult (2 of 2 - PPSV23) Completed today  . INFLUENZA VACCINE  11/27/2016  . MAMMOGRAM  02/04/2018  . TETANUS/TDAP  05/01/2019  . COLONOSCOPY  06/28/2023  . DEXA SCAN  Completed  . Hepatitis C Screening  Excluded

## 2016-10-03 LAB — CBC WITH DIFFERENTIAL/PLATELET
Basophils Absolute: 0 10*3/uL (ref 0.0–0.2)
Basos: 0 %
EOS (ABSOLUTE): 0.1 10*3/uL (ref 0.0–0.4)
Eos: 1 %
Hematocrit: 40.9 % (ref 34.0–46.6)
Hemoglobin: 13.7 g/dL (ref 11.1–15.9)
Immature Grans (Abs): 0 10*3/uL (ref 0.0–0.1)
Immature Granulocytes: 0 %
Lymphocytes Absolute: 0.8 10*3/uL (ref 0.7–3.1)
Lymphs: 20 %
MCH: 32.5 pg (ref 26.6–33.0)
MCHC: 33.5 g/dL (ref 31.5–35.7)
MCV: 97 fL (ref 79–97)
Monocytes Absolute: 0.4 10*3/uL (ref 0.1–0.9)
Monocytes: 9 %
Neutrophils Absolute: 2.8 10*3/uL (ref 1.4–7.0)
Neutrophils: 70 %
Platelets: 229 10*3/uL (ref 150–379)
RBC: 4.22 x10E6/uL (ref 3.77–5.28)
RDW: 14 % (ref 12.3–15.4)
WBC: 4 10*3/uL (ref 3.4–10.8)

## 2016-10-03 LAB — LIPID PANEL
CHOL/HDL RATIO: 1.9 ratio (ref 0.0–4.4)
Cholesterol, Total: 154 mg/dL (ref 100–199)
HDL: 81 mg/dL (ref >39)
LDL Calculated: 59 mg/dL (ref 0–99)
TRIGLYCERIDES: 69 mg/dL (ref 0–149)
VLDL CHOLESTEROL CAL: 14 mg/dL (ref 5–40)

## 2016-10-03 LAB — COMPREHENSIVE METABOLIC PANEL
A/G RATIO: 1.7 (ref 1.2–2.2)
ALT: 24 IU/L (ref 0–32)
AST: 33 IU/L (ref 0–40)
Albumin: 4.5 g/dL (ref 3.5–4.8)
Alkaline Phosphatase: 48 IU/L (ref 39–117)
BUN/Creatinine Ratio: 13 (ref 12–28)
BUN: 9 mg/dL (ref 8–27)
Bilirubin Total: 0.4 mg/dL (ref 0.0–1.2)
CALCIUM: 9.6 mg/dL (ref 8.7–10.3)
CO2: 25 mmol/L (ref 18–29)
Chloride: 96 mmol/L (ref 96–106)
Creatinine, Ser: 0.67 mg/dL (ref 0.57–1.00)
GFR calc Af Amer: 103 mL/min/{1.73_m2} (ref >59)
GFR, EST NON AFRICAN AMERICAN: 89 mL/min/{1.73_m2} (ref >59)
GLUCOSE: 92 mg/dL (ref 65–99)
Globulin, Total: 2.6 g/dL (ref 1.5–4.5)
Potassium: 4.3 mmol/L (ref 3.5–5.2)
Sodium: 133 mmol/L — ABNORMAL LOW (ref 134–144)
Total Protein: 7.1 g/dL (ref 6.0–8.5)

## 2016-10-03 LAB — TSH: TSH: 0.81 u[IU]/mL (ref 0.450–4.500)

## 2016-10-08 ENCOUNTER — Telehealth: Payer: Self-pay | Admitting: Internal Medicine

## 2016-10-08 NOTE — Telephone Encounter (Signed)
Spoke to pt. We have not received fax. I called pharmacy and they are re-faxing the information. Informed pt and awaiting fax. Will call pt when get a response from insurance.

## 2016-10-08 NOTE — Telephone Encounter (Signed)
Spoke to pt and she stated that she was at her [facility] today and that they cannot fill the precription for varenicline (CHANTIX) 1 MG tablet until the form that they faxed to [facility] has been completed and returned. Please reach out to pt she stated that she is hard of hearing and would prefer a text message to: [email or web address]

## 2016-10-09 ENCOUNTER — Ambulatory Visit
Admission: RE | Admit: 2016-10-09 | Discharge: 2016-10-09 | Disposition: A | Discharge status: Home / Self Care | Payer: Medicare HMO | Source: Ambulatory Visit | Attending: Internal Medicine | Admitting: Internal Medicine

## 2016-10-09 DIAGNOSIS — I712 Thoracic aortic aneurysm, without rupture: Secondary | ICD-10-CM | POA: Insufficient documentation

## 2016-10-09 DIAGNOSIS — R918 Other nonspecific abnormal finding of lung field: Secondary | ICD-10-CM | POA: Diagnosis not present

## 2016-10-09 DIAGNOSIS — I7121 Aneurysm of the ascending aorta, without rupture: Secondary | ICD-10-CM

## 2016-10-09 HISTORY — DX: Essential (primary) hypertension: I10

## 2016-10-09 MED ORDER — IOPAMIDOL (ISOVUE-370) INJECTION 76%
100.0000 mL | Freq: Once | INTRAVENOUS | Status: AC | PRN
  Administered 2016-10-09: 100 mL via INTRAVENOUS

## 2016-10-11 ENCOUNTER — Telehealth: Payer: Self-pay

## 2016-10-11 NOTE — Telephone Encounter (Signed)
Called and informed pt chantix prior auth was approved.

## 2016-10-29 NOTE — Progress Notes (Signed)
[place]  [phone] [phone]) [phone]  ID: Cheyenne Cummings OB: 1945-10-16  MR#: [id number]  VZC#:[id number]  Cheyenne Cummings Care Team: Glean Hess, MD as PCP - General (Family Medicine)  CHIEF COMPLAINT: Right breast DCIS.  INTERVAL HISTORY: Cheyenne Cummings returns to clinic today for routine yearly follow-up. She currently feels well and is asymptomatic. She is tolerating tamoxifen without significant side effects, although she does complain of some mild hair thinning. She has no neurologic complaints. She denies any recent fevers or illnesses. She has no chest pain or shortness of breath. She denies any nausea, vomiting, constipation, or diarrhea. She has no urinary complaints. Cheyenne Cummings offers no specific complaints today.  REVIEW OF SYSTEMS:   Review of Systems  Constitutional: Negative.  Negative for fever, malaise/fatigue and weight loss.  Respiratory: Negative.  Negative for cough and shortness of breath.   Cardiovascular: Negative.  Negative for chest pain and leg swelling.  Gastrointestinal: Negative.  Negative for abdominal pain.  Genitourinary: Negative.   Musculoskeletal: Negative.   Skin: Negative.  Negative for rash.  Neurological: Negative.  Negative for weakness.  Psychiatric/Behavioral: Negative.  The Cheyenne Cummings is not nervous/anxious.     As per HPI. Otherwise, a complete review of systems is negative.  PAST MEDICAL HISTORY: Past Medical History:  Diagnosis Date  . Anxiety disorder   . Breast cancer ([place]) 01/27/2013   right DCIS lumpectomy, radiation   . Edema extremities   . Herpes simplex   . Hyperlipidemia   . Hypertension   . Hypothyroidism   . Osteoarthritis     PAST SURGICAL HISTORY: Past Surgical History:  Procedure Laterality Date  . ARTHROSCOPY WITH ANTERIOR CRUCIATE LIGAMENT (ACL) REPAIR WITH ANTERIOR TIBILIAS GRAFT Bilateral 1996, 2003  . BREAST BIOPSY Right 10/27/15   Benign - fat necrosis/post rad changes  . BREAST  LUMPECTOMY Right 2014   DCIS - XRT and Chemo  . BUNIONECTOMY Right 2015  . TONSILLECTOMY    . TOTAL HIP ARTHROPLASTY  2010  . TYMPANOPLASTY      FAMILY HISTORY Family History  Problem Relation Age of Onset  . Cancer Maternal Grandmother   . Breast cancer Other 72       niece       ADVANCED DIRECTIVES:    HEALTH MAINTENANCE: Social History  Substance Use Topics  . Smoking status: Current Every Day Smoker    Packs/day: 0.75    Years: 50.00    Types: Cigarettes  . Smokeless tobacco: Current User    Types: Chew  . Alcohol use 0.0 oz/week     Comment: wine     Colonoscopy:  PAP:  Bone density:  Lipid panel:  Allergies  Allergen Reactions  . Ace Inhibitors Cough    Current Outpatient Prescriptions  Medication Sig Dispense Refill  . acyclovir (ZOVIRAX) 200 MG capsule Take 1 capsule (200 mg total) by mouth 3 (three) times daily. 30 capsule 5  . cholecalciferol (VITAMIN D) 1000 UNITS tablet Take 1,000 Units by mouth daily.    . clonazePAM (KLONOPIN) 0.5 MG tablet Take 0.5 mg by mouth 2 (two) times daily.    . cyanocobalamin 1000 MCG tablet Take 1 tablet by mouth daily.    . fexofenadine (ALLEGRA) 180 MG tablet Take 1 tablet by mouth daily.    Marland Kitchen gabapentin (NEURONTIN) 100 MG capsule Take by mouth.    . levothyroxine (SYNTHROID, LEVOTHROID) 75 MCG tablet Take 1 tablet (75 mcg total) by mouth daily. 90 tablet 3  . losartan (COZAAR) 50  MG tablet TAKE 1 TABLET DAILY 90 tablet 3  . lovastatin (MEVACOR) 40 MG tablet TAKE 1 TABLET AT BEDTIME 90 tablet 3  . Multiple Vitamins-Minerals (MULTIVITAMIN WITH MINERALS) tablet Take 1 tablet by mouth daily.    . Naproxen Sodium 220 MG CAPS Take 1 capsule by mouth 2 (two) times daily as needed.    . tamoxifen (NOLVADEX) 20 MG tablet TAKE 1 TABLET DAILY 90 tablet 3  . varenicline (CHANTIX CONTINUING MONTH PAK) 1 MG tablet Take 1 tablet (1 mg total) by mouth 2 (two) times daily. 60 tablet 3  . varenicline (CHANTIX STARTING MONTH PAK) 0.5  MG X 11 & 1 MG X 42 tablet Take one 0.5 mg tablet by mouth once daily for 3 days, then increase to one 0.5 mg tablet twice daily for 4 days, then increase to one 1 mg tablet twice daily. 53 tablet 0  . venlafaxine XR (EFFEXOR-XR) 75 MG 24 hr capsule Take 1 capsule (75 mg total) by mouth daily. 90 capsule 3   No current facility-administered medications for this visit.     OBJECTIVE: Vitals:   11/01/16 0859  BP: 140/88  Pulse: 79  Resp: 20  Temp: 99.1 F (37.3 C)     Body mass index is 22.4 kg/m.    ECOG FS:0 - Asymptomatic  General: Well-developed, well-nourished, no acute distress. Eyes: Pink conjunctiva, anicteric sclera. Breasts: Bilateral breast and axilla without lumps or masses. Lungs: Clear to auscultation bilaterally. Heart: Regular rate and rhythm. No rubs, murmurs, or gallops. Abdomen: Soft, nontender, nondistended. No organomegaly noted, normoactive bowel sounds. Musculoskeletal: No edema, cyanosis, or clubbing. Neuro: Alert, answering all questions appropriately. Cranial nerves grossly intact. Skin: No rashes or petechiae noted. Psych: Normal affect.  LAB RESULTS:  Lab Results  Component Value Date   NA 133 (L) 10/02/2016   K 4.3 10/02/2016   CL 96 10/02/2016   CO2 25 10/02/2016   GLUCOSE 92 10/02/2016   BUN 9 10/02/2016   CREATININE 0.67 10/02/2016   CALCIUM 9.6 10/02/2016   PROT 7.1 10/02/2016   ALBUMIN 4.5 10/02/2016   AST 33 10/02/2016   ALT 24 10/02/2016   ALKPHOS 48 10/02/2016   BILITOT 0.4 10/02/2016   GFRNONAA 89 10/02/2016   GFRAA 103 10/02/2016    Lab Results  Component Value Date   WBC 4.0 10/02/2016   NEUTROABS 2.8 10/02/2016   HGB 13.7 10/02/2016   HCT 40.9 10/02/2016   MCV 97 10/02/2016   PLT 229 10/02/2016     STUDIES: Ct Angio Chest Aorta W/cm &/or Wo/cm  Result Date: 10/09/2016 CLINICAL DATA:  Thoracic ascending aortic aneurysm. EXAM: CT ANGIOGRAPHY CHEST WITH CONTRAST TECHNIQUE: Multidetector CT imaging of the chest was  performed using the standard protocol during bolus administration of intravenous contrast. Multiplanar CT image reconstructions and MIPs were obtained to evaluate the vascular anatomy. CONTRAST:  100 mL of Isovue 370 intravenously. COMPARISON:  CT scan of January 02, 2016. FINDINGS: Cardiovascular: There is no evidence of thoracic aortic dissection or aneurysm. Ascending thoracic aorta is mildly dilated at 3.6 cm which is stable compared to prior exam. Great vessels are widely patent without significant stenosis. Visualized pulmonary arteries are unremarkable. Transverse aortic arch measures 2.4 cm. Proximal descending thoracic aorta measures 2.4 cm. Mediastinum/Nodes: Stable calcified right hilar and subcarinal lymph nodes are noted. No other adenopathy is noted. Thyroid gland is unremarkable. Lungs/Pleura: Lungs are clear. No pleural effusion or pneumothorax. Upper Abdomen: No acute abnormality. Musculoskeletal: No chest wall abnormality. No acute  or significant osseous findings. Review of the MIP images confirms the above findings. IMPRESSION: Stable dilatation of ascending thoracic aorta at 3.6 cm. No acute abnormality is seen in the chest. Electronically Signed   By: Marijo Conception, M.D.   On: 10/09/2016 14:03    ASSESSMENT: Right breast DCIS:  PLAN:    1. Right breast DCIS: Cheyenne Cummings had partial mastectomy on February 25, 2013. She subsequently completed XRT and initiated tamoxifen in January 2015. She will complete her 5 years of tamoxifen in January 2020. Cheyenne Cummings had a right breast biopsy on October 30, 2015 that was negative for malignancy. Her most recent mammogram on February 05, 2016 was reported as BI-RADS 2, repeat in October 2018. No further intervention is needed. Cheyenne Cummings continues to request yearly follow-up, therefore will return to clinic in July 2019 for further evaluation.  Approximately 20 minutes was spent in discussion of which greater than 50% was consultation.  Cheyenne Cummings expressed  understanding and was in agreement with this plan. She also understands that She can call clinic at any time with any questions, concerns, or complaints.    Lloyd Huger, MD   11/01/2016 2:34 PM

## 2016-11-01 ENCOUNTER — Inpatient Hospital Stay: Payer: Medicare HMO | Attending: Oncology | Admitting: Oncology

## 2016-11-01 VITALS — BP 140/88 | HR 79 | Temp 99.1°F | Resp 20 | Wt 134.6 lb

## 2016-11-01 DIAGNOSIS — Z923 Personal history of irradiation: Secondary | ICD-10-CM

## 2016-11-01 DIAGNOSIS — E785 Hyperlipidemia, unspecified: Secondary | ICD-10-CM | POA: Insufficient documentation

## 2016-11-01 DIAGNOSIS — Z79899 Other long term (current) drug therapy: Secondary | ICD-10-CM | POA: Insufficient documentation

## 2016-11-01 DIAGNOSIS — I10 Essential (primary) hypertension: Secondary | ICD-10-CM | POA: Diagnosis not present

## 2016-11-01 DIAGNOSIS — D0511 Intraductal carcinoma in situ of right breast: Secondary | ICD-10-CM | POA: Insufficient documentation

## 2016-11-01 DIAGNOSIS — E039 Hypothyroidism, unspecified: Secondary | ICD-10-CM | POA: Insufficient documentation

## 2016-11-01 DIAGNOSIS — Z79810 Long term (current) use of selective estrogen receptor modulators (SERMs): Secondary | ICD-10-CM | POA: Insufficient documentation

## 2016-11-01 DIAGNOSIS — I712 Thoracic aortic aneurysm, without rupture: Secondary | ICD-10-CM | POA: Diagnosis not present

## 2016-11-01 DIAGNOSIS — M1990 Unspecified osteoarthritis, unspecified site: Secondary | ICD-10-CM | POA: Diagnosis not present

## 2016-11-01 DIAGNOSIS — F17210 Nicotine dependence, cigarettes, uncomplicated: Secondary | ICD-10-CM

## 2016-11-01 DIAGNOSIS — Z170 Estrogen receptor positive status [ER+]: Secondary | ICD-10-CM | POA: Diagnosis not present

## 2016-11-01 DIAGNOSIS — R69 Illness, unspecified: Secondary | ICD-10-CM | POA: Diagnosis not present

## 2016-11-01 DIAGNOSIS — F419 Anxiety disorder, unspecified: Secondary | ICD-10-CM | POA: Diagnosis not present

## 2016-11-01 NOTE — Progress Notes (Signed)
Patient denies any concerns today, tolerating Tamoxifen well.   

## 2016-11-28 ENCOUNTER — Other Ambulatory Visit: Payer: Self-pay | Admitting: Internal Medicine

## 2016-11-29 ENCOUNTER — Other Ambulatory Visit: Payer: Self-pay

## 2016-11-29 MED ORDER — VENLAFAXINE HCL ER 75 MG PO CP24: 75.0000 mg | ORAL_CAPSULE | Freq: Every day | ORAL | 3 refills | Status: DC

## 2017-02-05 DIAGNOSIS — M79671 Pain in right foot: Secondary | ICD-10-CM | POA: Diagnosis not present

## 2017-02-05 DIAGNOSIS — S93144A Subluxation of metatarsophalangeal joint of right lesser toe(s), initial encounter: Secondary | ICD-10-CM | POA: Diagnosis not present

## 2017-02-05 DIAGNOSIS — M2011 Hallux valgus (acquired), right foot: Secondary | ICD-10-CM | POA: Diagnosis not present

## 2017-02-05 DIAGNOSIS — M2041 Other hammer toe(s) (acquired), right foot: Secondary | ICD-10-CM | POA: Diagnosis not present

## 2017-02-05 DIAGNOSIS — M25541 Pain in joints of right hand: Secondary | ICD-10-CM | POA: Diagnosis not present

## 2017-02-05 DIAGNOSIS — M2141 Flat foot [pes planus] (acquired), right foot: Secondary | ICD-10-CM | POA: Diagnosis not present

## 2017-02-06 ENCOUNTER — Ambulatory Visit
Admission: RE | Admit: 2017-02-06 | Discharge: 2017-02-06 | Disposition: A | Discharge status: Home / Self Care | Payer: Medicare HMO | Source: Ambulatory Visit | Attending: Oncology | Admitting: Oncology

## 2017-02-06 DIAGNOSIS — Z853 Personal history of malignant neoplasm of breast: Secondary | ICD-10-CM | POA: Diagnosis not present

## 2017-02-06 DIAGNOSIS — D0511 Intraductal carcinoma in situ of right breast: Secondary | ICD-10-CM

## 2017-02-06 DIAGNOSIS — Z08 Encounter for follow-up examination after completed treatment for malignant neoplasm: Secondary | ICD-10-CM | POA: Insufficient documentation

## 2017-02-06 DIAGNOSIS — R928 Other abnormal and inconclusive findings on diagnostic imaging of breast: Secondary | ICD-10-CM | POA: Diagnosis not present

## 2017-02-06 HISTORY — DX: Personal history of irradiation: Z92.3

## 2017-02-13 DIAGNOSIS — H2513 Age-related nuclear cataract, bilateral: Secondary | ICD-10-CM | POA: Diagnosis not present

## 2017-02-17 DIAGNOSIS — R69 Illness, unspecified: Secondary | ICD-10-CM | POA: Diagnosis not present

## 2017-02-18 DIAGNOSIS — R69 Illness, unspecified: Secondary | ICD-10-CM | POA: Diagnosis not present

## 2017-02-25 ENCOUNTER — Other Ambulatory Visit: Payer: Self-pay

## 2017-02-25 ENCOUNTER — Ambulatory Visit (INDEPENDENT_AMBULATORY_CARE_PROVIDER_SITE_OTHER): Payer: Medicare HMO | Admitting: Internal Medicine

## 2017-02-25 ENCOUNTER — Encounter: Payer: Self-pay | Admitting: Internal Medicine

## 2017-02-25 VITALS — BP 118/60 | HR 86 | Ht 65.0 in | Wt 135.0 lb

## 2017-02-25 DIAGNOSIS — H6981 Other specified disorders of Eustachian tube, right ear: Secondary | ICD-10-CM | POA: Diagnosis not present

## 2017-02-25 DIAGNOSIS — K146 Glossodynia: Secondary | ICD-10-CM

## 2017-02-25 DIAGNOSIS — F17200 Nicotine dependence, unspecified, uncomplicated: Secondary | ICD-10-CM

## 2017-02-25 MED ORDER — VARENICLINE TARTRATE 0.5 MG X 11 & 1 MG X 42 PO MISC: ORAL | 0 refills | Status: DC

## 2017-02-25 MED ORDER — MAGIC MOUTHWASH: 5.0000 mL | Freq: Four times a day (QID) | ORAL | 0 refills | Status: AC

## 2017-02-25 NOTE — Patient Instructions (Addendum)
B-Complex vitamin daily  Try Afrin or Sudafed nightly for ear symptoms

## 2017-02-25 NOTE — Progress Notes (Signed)
Date:  02/25/2017   Name:  Cheyenne Cummings   DOB:  Jan 30, 1946   MRN:  [id number]   Chief Complaint: Ear Pain (Rt ear- core on the outside of ear- under ear and down neck) and Tongue Pain (Started week and half ago that tongue feels sore. Doesn't matter what eats- tongue burns and is sore. ) Otalgia   There is pain in the right ear. This is a new problem. The current episode started in the past 7 days. The problem occurs constantly. The problem has been unchanged. There has been no fever. The pain is mild. Associated symptoms include hearing loss. Pertinent negatives include no sore throat. She has tried nothing for the symptoms.   Tongue burning - started about a week ago - some white plaque noted. No recent antibiotics.  Not taking B complex vitamins.   Review of Systems  Constitutional: Negative for chills, fatigue and fever.  HENT: Positive for ear pain, hearing loss and mouth sores (unchanged). Negative for sinus pressure, sneezing, sore throat and trouble swallowing. Postnasal drip: tongue burning and taste disrupted.   Eyes: Negative for visual disturbance.  Respiratory: Negative for choking and shortness of breath.   Cardiovascular: Negative for chest pain.    Patient Active Problem List   Diagnosis Date Noted  . Hypothyroidism due to acquired atrophy of thyroid 10/02/2016  . Tinnitus of right ear 05/01/2016  . Thoracic ascending aortic aneurysm ([place]) 01/04/2016  . Ductal carcinoma in situ (DCIS) of right breast 09/18/2015  . Tobacco use disorder 09/18/2015  . Alphaherpesviral disease 10/01/2014  . Hyperlipidemia 10/01/2014  . Anxiety disorder due to known physiological condition 10/01/2014  . Personal history of tobacco use, presenting hazards to health 10/01/2014  . Environmental and seasonal allergies 10/01/2014  . BB block 10/01/2014  . Edema extremities 10/01/2014  . Essential (primary) hypertension 10/01/2014  . History of colon polyps 10/01/2014  . Family history  of aneurysm 10/01/2014  . Arthritis of knee, degenerative 10/01/2014  . Addison anemia 10/01/2014  . Leg pain 02/11/2014  . Leg paresthesia 02/11/2014  . Plantar fasciitis 02/11/2014  . Neuropathy due to ionizing radiation ([place]) 02/11/2014    Prior to Admission medications   Medication Sig Start Date End Date Taking? Authorizing Provider  acyclovir (ZOVIRAX) 200 MG capsule Take 1 capsule (200 mg total) by mouth 3 (three) times daily. 06/03/16  Yes Glean Hess, MD  cholecalciferol (VITAMIN D) 1000 UNITS tablet Take 1,000 Units by mouth daily.   Yes [provider]  clonazePAM (KLONOPIN) 0.5 MG tablet Take 0.5 mg by mouth 2 (two) times daily.   Yes [provider]  cyanocobalamin 1000 MCG tablet Take 1 tablet by mouth daily.   Yes [provider]  fexofenadine (ALLEGRA) 180 MG tablet Take 1 tablet by mouth daily.   Yes [provider]  gabapentin (NEURONTIN) 100 MG capsule Take by mouth. 04/10/16 04/10/17 Yes [provider]  levothyroxine (SYNTHROID, LEVOTHROID) 75 MCG tablet Take 1 tablet (75 mcg total) by mouth daily. 08/16/16  Yes Glean Hess, MD  losartan (COZAAR) 50 MG tablet TAKE 1 TABLET DAILY 08/02/16  Yes Glean Hess, MD  lovastatin (MEVACOR) 40 MG tablet TAKE 1 TABLET AT BEDTIME 08/02/16  Yes Glean Hess, MD  Multiple Vitamins-Minerals (MULTIVITAMIN WITH MINERALS) tablet Take 1 tablet by mouth daily.   Yes [provider]  Naproxen Sodium 220 MG CAPS Take 1 capsule by mouth 2 (two) times daily as needed.  Yes [provider]  tamoxifen (NOLVADEX) 20 MG tablet TAKE 1 TABLET DAILY 08/30/16  Yes Glean Hess, MD  venlafaxine XR (EFFEXOR-XR) 75 MG 24 hr capsule TAKE 1 CAPSULE DAILY 11/29/16  Yes Glean Hess, MD    Allergies  Allergen Reactions  . Ace Inhibitors Cough    Past Surgical History:  Procedure Laterality Date  . ARTHROSCOPY WITH ANTERIOR CRUCIATE LIGAMENT (ACL) REPAIR WITH ANTERIOR  TIBILIAS GRAFT Bilateral 1996, 2003  . BREAST BIOPSY Right 10/27/15   Benign - fat necrosis/post rad changes  . BREAST LUMPECTOMY Right 2014   DCIS - XRT and Chemo  . BUNIONECTOMY Right 2015  . TONSILLECTOMY    . TOTAL HIP ARTHROPLASTY  2010  . TYMPANOPLASTY      Social History  Substance Use Topics  . Smoking status: Current Every Day Smoker    Packs/day: 0.75    Years: 50.00    Types: Cigarettes  . Smokeless tobacco: Current User    Types: Chew  . Alcohol use 0.0 oz/week     Comment: wine     Medication list has been reviewed and updated.  PHQ 2/9 Scores 10/02/2016 09/18/2015 01/06/2015  PHQ - 2 Score 0 0 0    Physical Exam  Constitutional: She is oriented to person, place, and time. She appears well-developed. No distress.  HENT:  Head: Normocephalic and atraumatic.  Right Ear: Tympanic membrane and ear canal normal.  Left Ear: Tympanic membrane and ear canal normal.  Nose: Right sinus exhibits no maxillary sinus tenderness. Left sinus exhibits no maxillary sinus tenderness.  Mouth/Throat: No posterior oropharyngeal edema or posterior oropharyngeal erythema.    Cardiovascular: Normal rate, regular rhythm and normal heart sounds.   Pulmonary/Chest: Effort normal and breath sounds normal. No respiratory distress. She has no wheezes.  Musculoskeletal: Normal range of motion.  Neurological: She is alert and oriented to person, place, and time.  Skin: Skin is warm and dry. No rash noted.  Psychiatric: She has a normal mood and affect. Her behavior is normal. Thought content normal.  Nursing note and vitals reviewed.   BP 118/60   Pulse 86   Ht 5\' 5"  (1.651 m)   Wt 135 lb (61.2 kg)   SpO2 99%   BMI 22.47 kg/m   Assessment and Plan: 1. Tongue burning sensation Treat for mild thrush Add B complex vitamin daily - magic mouthwash SOLN; Take 5 mLs by mouth 4 (four) times daily.  Dispense: 150 mL; Refill: 0  2. Eustachian tube dysfunction, right Try Sudafed and/or  Afrin    Meds ordered this encounter  Medications  . magic mouthwash SOLN    Sig: Take 5 mLs by mouth 4 (four) times daily.    Dispense:  150 mL    Refill:  0    Partially dictated using Editor, commissioning. Any errors are unintentional.  Halina Maidens, MD [facility]  02/25/2017

## 2017-03-04 ENCOUNTER — Other Ambulatory Visit: Payer: Self-pay | Admitting: Internal Medicine

## 2017-03-04 ENCOUNTER — Other Ambulatory Visit: Payer: Self-pay

## 2017-03-04 MED ORDER — VENLAFAXINE HCL ER 75 MG PO CP24: 75.0000 mg | ORAL_CAPSULE | Freq: Every day | ORAL | 0 refills | Status: DC

## 2017-03-13 DIAGNOSIS — R69 Illness, unspecified: Secondary | ICD-10-CM | POA: Diagnosis not present

## 2017-03-13 DIAGNOSIS — G629 Polyneuropathy, unspecified: Secondary | ICD-10-CM | POA: Diagnosis not present

## 2017-03-13 DIAGNOSIS — M2011 Hallux valgus (acquired), right foot: Secondary | ICD-10-CM | POA: Diagnosis not present

## 2017-03-13 DIAGNOSIS — X58XXXA Exposure to other specified factors, initial encounter: Secondary | ICD-10-CM | POA: Diagnosis not present

## 2017-03-13 DIAGNOSIS — M2041 Other hammer toe(s) (acquired), right foot: Secondary | ICD-10-CM | POA: Diagnosis not present

## 2017-03-13 DIAGNOSIS — Z9889 Other specified postprocedural states: Secondary | ICD-10-CM | POA: Diagnosis not present

## 2017-03-13 DIAGNOSIS — I10 Essential (primary) hypertension: Secondary | ICD-10-CM | POA: Diagnosis not present

## 2017-03-13 DIAGNOSIS — Z853 Personal history of malignant neoplasm of breast: Secondary | ICD-10-CM | POA: Diagnosis not present

## 2017-03-13 DIAGNOSIS — S93144A Subluxation of metatarsophalangeal joint of right lesser toe(s), initial encounter: Secondary | ICD-10-CM | POA: Diagnosis not present

## 2017-03-13 DIAGNOSIS — K219 Gastro-esophageal reflux disease without esophagitis: Secondary | ICD-10-CM | POA: Diagnosis not present

## 2017-04-01 DIAGNOSIS — M2011 Hallux valgus (acquired), right foot: Secondary | ICD-10-CM | POA: Diagnosis not present

## 2017-04-13 IMAGING — MG MM DIGITAL DIAGNOSTIC UNILAT*R* W/ TOMO W/ CAD
8 of 10 series · 8 of 22 positions shown · non-contrast
Comparison: Previous exam(s).

CLINICAL DATA: 70-year-old female with a palpable abnormality near
the lumpectomy site in the right breast. History of lumpectomy
02/25/2013 for right breast DCIS followed by radiation therapy.

EXAM:
2D DIGITAL DIAGNOSTIC UNILATERAL RIGHT MAMMOGRAM WITH CAD AND
ADJUNCT TOMO
RIGHT BREAST ULTRASOUND

[R MLO (1 of 2)]
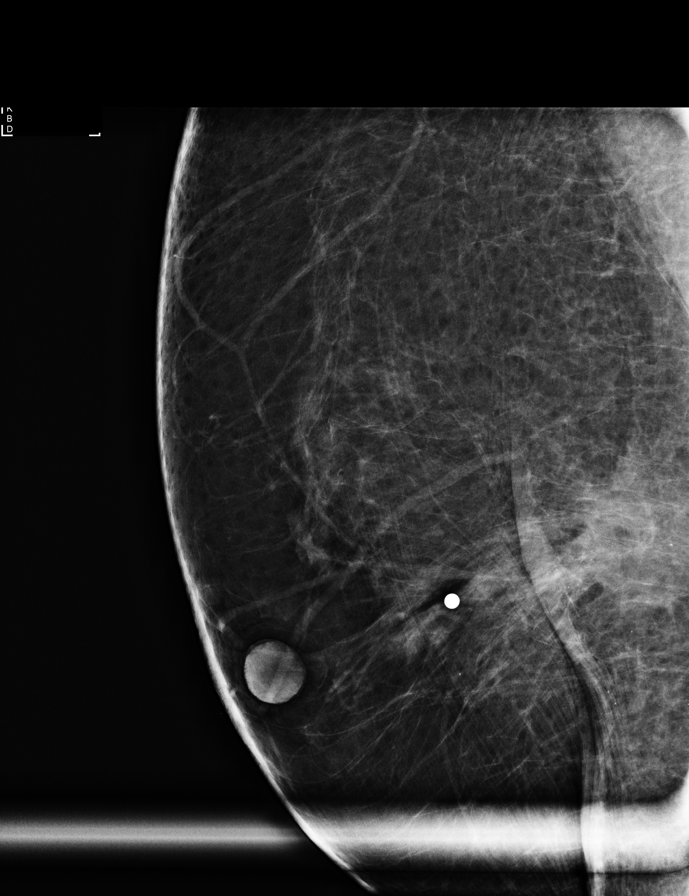

[R CC (1 of 2)]
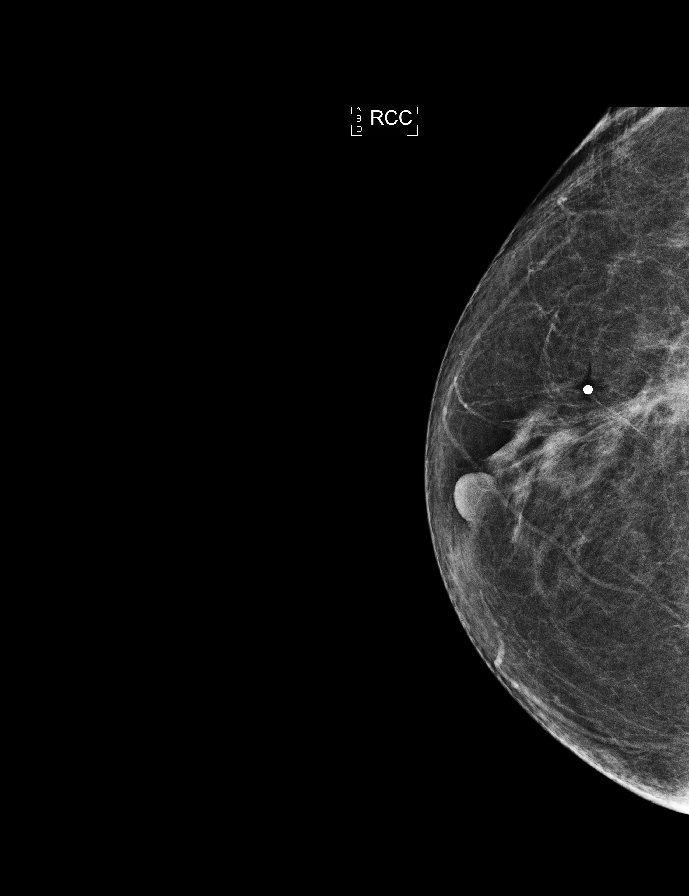

[R CC synth-2D (1 of 2)]
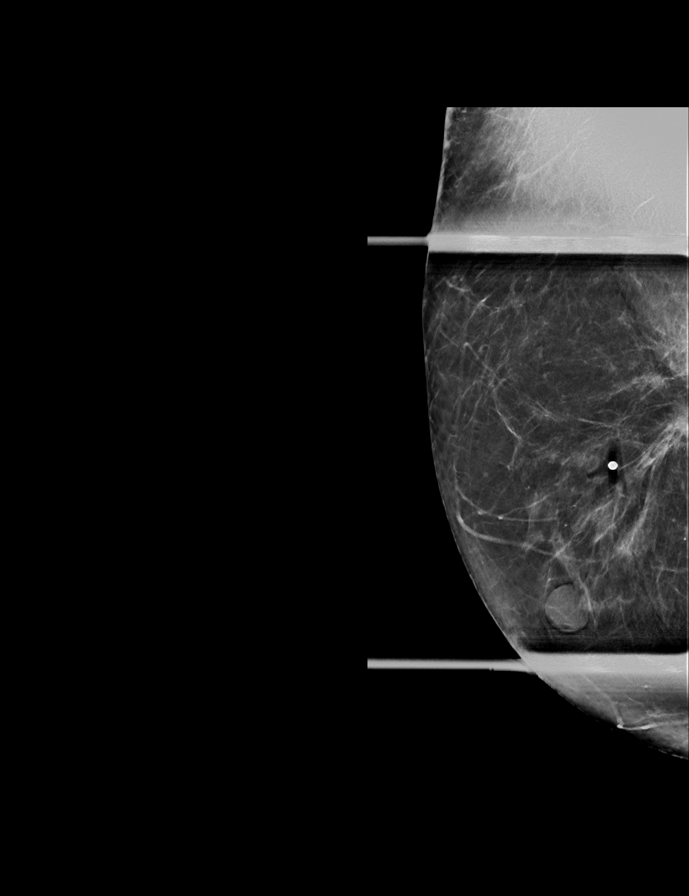

[R CC (2 of 2)]
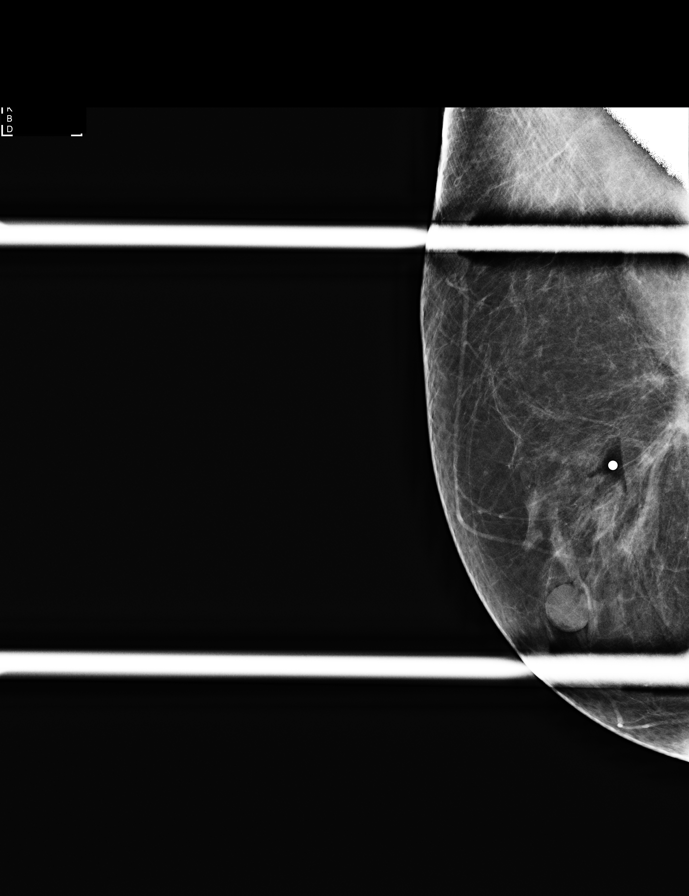

[R MLO (2 of 2)]
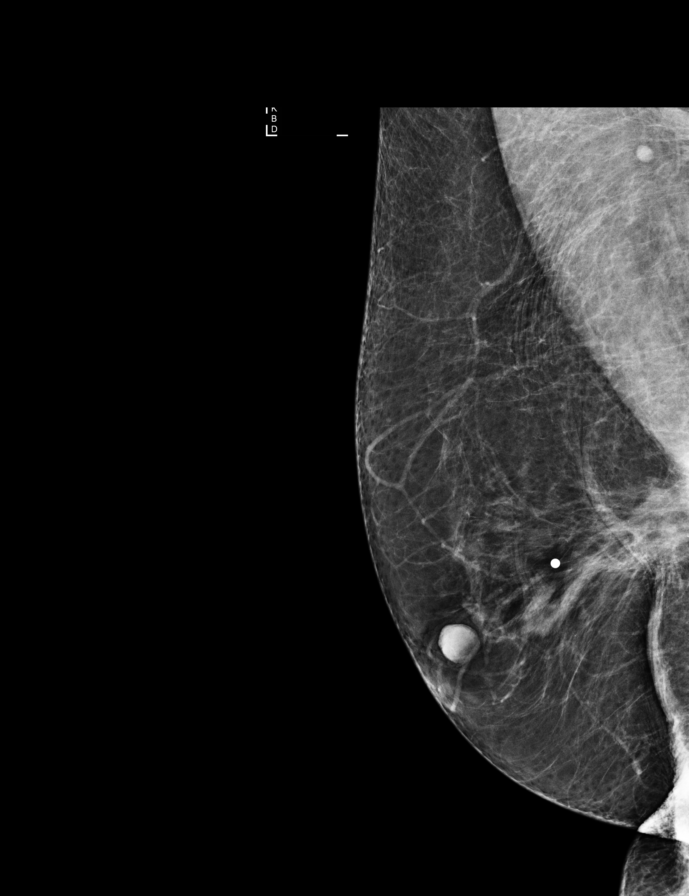

[R MLO synth-2D]
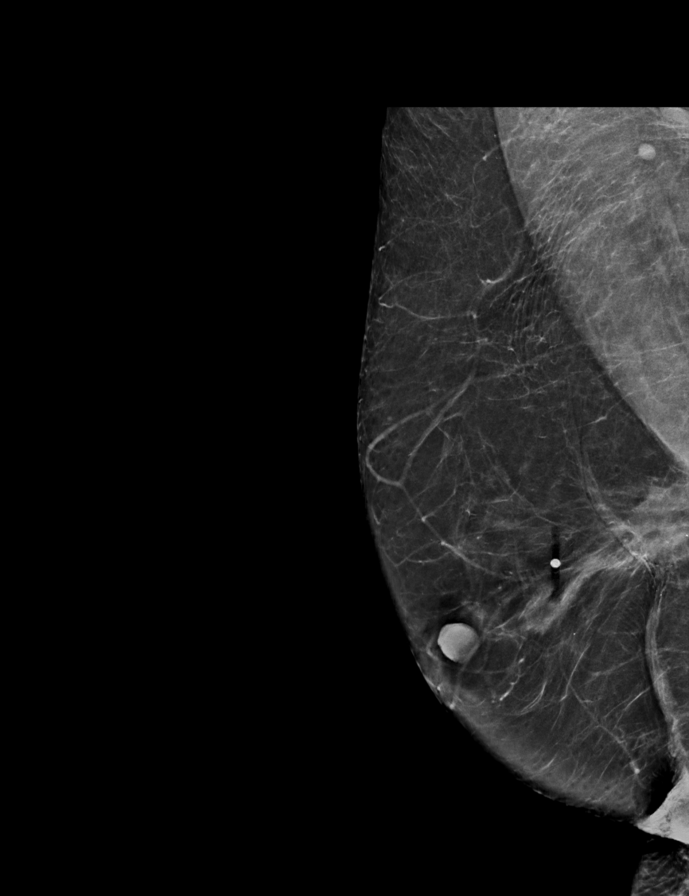

[R CC synth-2D (2 of 2)]
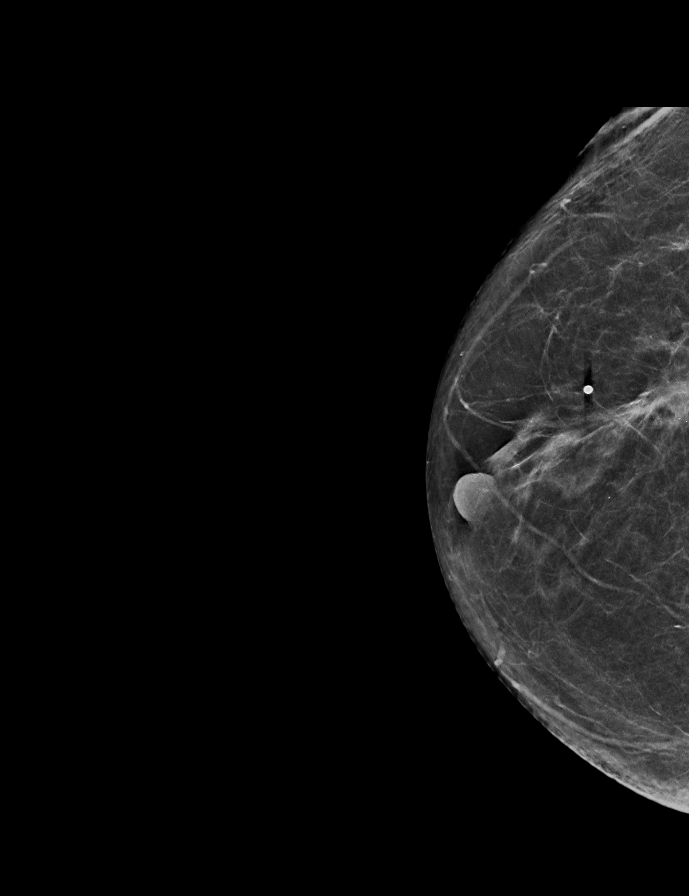

[R MLO tomo · tomo slice 31/62.0]
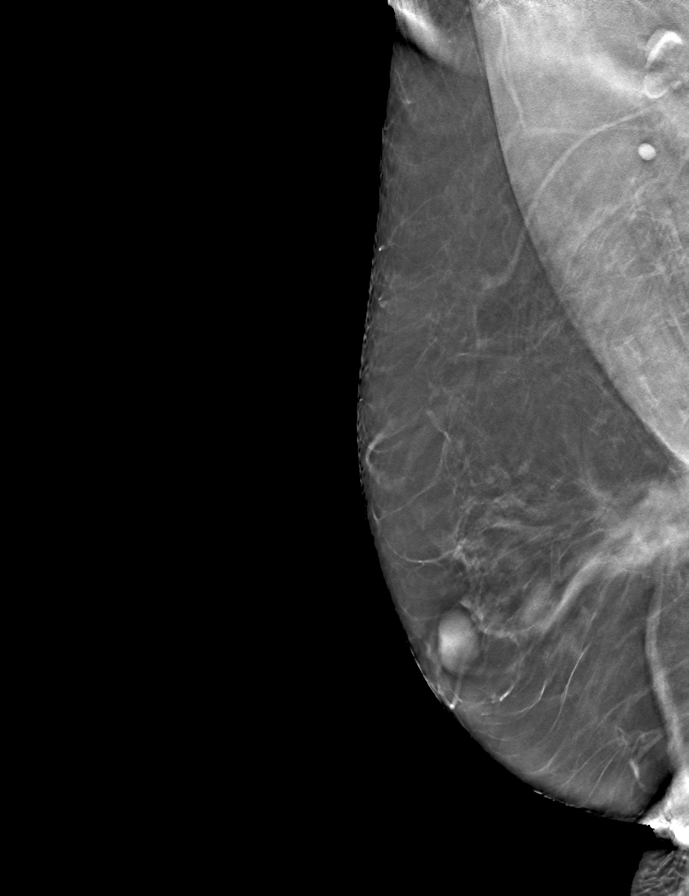

[8 of 22 positions shown; findings below may reference images not displayed]

ACR Breast Density Category b: There are scattered areas of
fibroglandular density.
FINDINGS: No suspicious masses or calcifications are seen in the right breast.
Postsurgical changes are present in the central to posterior right
breast related to prior lumpectomy. A spot compression tangential
view of the lumpectomy site in the right breast was performed which
appear stable. In addition spot compression CC tomograms were
performed over the palpable area of concern in the right breast with
only postsurgical change identified.

Mammographic images were processed with CAD.

Physical examination at site of palpable concern in the outer right
breast reveals a firm nodular area slightly medial to the lumpectomy
scar.

Targeted ultrasound of the right breast was performed demonstrating
an irregular hypoechoic masslike area at the [DATE] position 3 cm from
nipple measuring up to 2.8 x 1.2 by 1.1 cm. This does demonstrates
the appearance of postsurgical change/scar, however the more focal
palpable area appears slightly more irregular and demonstrates
increased vascularity and is therefore indeterminate. No
lymphadenopathy seen in the right axilla.
IMPRESSION: Indeterminate right breast mass which may be related to postsurgical
change, however the more focal palpable area appears slightly more
irregular with increased vascularity and therefore is considered
suspicious.

RECOMMENDATION:
Ultrasound-guided biopsy of the palpable mass in the right breast at
the [DATE] position is recommended. This will be scheduled for the
patient.

I have discussed the findings and recommendations with the patient.
Results were also provided in writing at the conclusion of the
visit. If applicable, a reminder letter will be sent to the patient
regarding the next appointment.

BI-RADS CATEGORY  4: Suspicious.

## 2017-04-23 DIAGNOSIS — M2011 Hallux valgus (acquired), right foot: Secondary | ICD-10-CM | POA: Diagnosis not present

## 2017-04-23 DIAGNOSIS — M79671 Pain in right foot: Secondary | ICD-10-CM | POA: Diagnosis not present

## 2017-04-23 DIAGNOSIS — Z981 Arthrodesis status: Secondary | ICD-10-CM | POA: Diagnosis not present

## 2017-05-06 ENCOUNTER — Other Ambulatory Visit: Payer: Self-pay | Admitting: Internal Medicine

## 2017-05-06 MED ORDER — VENLAFAXINE HCL ER 75 MG PO CP24: 75.0000 mg | ORAL_CAPSULE | Freq: Every day | ORAL | 1 refills | Status: DC

## 2017-05-16 ENCOUNTER — Ambulatory Visit (INDEPENDENT_AMBULATORY_CARE_PROVIDER_SITE_OTHER): Payer: Medicare HMO | Admitting: Internal Medicine

## 2017-05-16 ENCOUNTER — Encounter: Payer: Self-pay | Admitting: Internal Medicine

## 2017-05-16 VITALS — BP 122/78 | HR 101 | Ht 65.0 in | Wt 140.8 lb

## 2017-05-16 DIAGNOSIS — S46919A Strain of unspecified muscle, fascia and tendon at shoulder and upper arm level, unspecified arm, initial encounter: Secondary | ICD-10-CM | POA: Diagnosis not present

## 2017-05-16 NOTE — Progress Notes (Signed)
Date:  05/16/2017   Name:  Cheyenne Cummings   DOB:  12/09/1945   MRN:  [id number]   Chief Complaint: Shoulder Pain (Left shoulder pain. Started hurting about a month ago. From being non-weight bearing on feet she feels like she has been scooting a lot in bed and causing shoulder issues. )  Shoulder Pain   The pain is present in the left shoulder and right shoulder. This is a new problem. The current episode started 1 to 4 weeks ago. The problem occurs daily. The problem has been gradually improving. Pertinent negatives include no fever. She has tried NSAIDS for the symptoms.  She had foot surgery on right and was non wt bearing for 6 weeks.  She had to use her arms more to move around in bed, etc.   Review of Systems  Constitutional: Negative for chills, fatigue and fever.  Respiratory: Negative for chest tightness and shortness of breath.   Cardiovascular: Negative for chest pain and palpitations.  Musculoskeletal: Positive for arthralgias and gait problem.  Neurological: Negative for dizziness and headaches.    Patient Active Problem List   Diagnosis Date Noted  . Hypothyroidism due to acquired atrophy of thyroid 10/02/2016  . Tinnitus of right ear 05/01/2016  . Thoracic ascending aortic aneurysm ([place]) 01/04/2016  . Ductal carcinoma in situ (DCIS) of right breast 09/18/2015  . Tobacco use disorder 09/18/2015  . Alphaherpesviral disease 10/01/2014  . Hyperlipidemia 10/01/2014  . Anxiety disorder due to known physiological condition 10/01/2014  . Personal history of tobacco use, presenting hazards to health 10/01/2014  . Environmental and seasonal allergies 10/01/2014  . BB block 10/01/2014  . Edema extremities 10/01/2014  . Essential (primary) hypertension 10/01/2014  . History of colon polyps 10/01/2014  . Family history of aneurysm 10/01/2014  . Arthritis of knee, degenerative 10/01/2014  . Addison anemia 10/01/2014  . Leg pain 02/11/2014  . Leg paresthesia 02/11/2014  .  Plantar fasciitis 02/11/2014  . Neuropathy due to ionizing radiation ([facility]) 02/11/2014    Prior to Admission medications   Medication Sig Start Date End Date Taking? Authorizing Provider  acyclovir (ZOVIRAX) 200 MG capsule Take 1 capsule (200 mg total) by mouth 3 (three) times daily. 06/03/16  Yes Glean Hess, MD  cholecalciferol (VITAMIN D) 1000 UNITS tablet Take 1,000 Units by mouth daily.   Yes [provider]  clonazePAM (KLONOPIN) 0.5 MG tablet Take 0.5 mg by mouth 2 (two) times daily.   Yes [provider]  cyanocobalamin 1000 MCG tablet Take 1 tablet by mouth daily.   Yes [provider]  fexofenadine (ALLEGRA) 180 MG tablet Take 1 tablet by mouth daily.   Yes [provider]  gabapentin (NEURONTIN) 300 MG capsule Take 300 mg by mouth daily.   Yes [provider]  levothyroxine (SYNTHROID, LEVOTHROID) 75 MCG tablet Take 1 tablet (75 mcg total) by mouth daily. 08/16/16  Yes Glean Hess, MD  losartan (COZAAR) 50 MG tablet TAKE 1 TABLET DAILY 08/02/16  Yes Glean Hess, MD  lovastatin (MEVACOR) 40 MG tablet TAKE 1 TABLET AT BEDTIME 08/02/16  Yes Glean Hess, MD  Multiple Vitamins-Minerals (MULTIVITAMIN WITH MINERALS) tablet Take 1 tablet by mouth daily.   Yes [provider]  Naproxen Sodium 220 MG CAPS Take 1 capsule by mouth 2 (two) times daily as needed.   Yes [provider]  tamoxifen (NOLVADEX) 20 MG tablet TAKE 1 TABLET DAILY 08/30/16  Yes Glean Hess, MD  venlafaxine XR (EFFEXOR-XR) 75 MG 24 hr capsule Take 1 capsule (75 mg total) by mouth daily. 05/06/17  Yes Glean Hess, MD    Allergies  Allergen Reactions  . Ace Inhibitors Cough    Past Surgical History:  Procedure Laterality Date  . ARTHROSCOPY WITH ANTERIOR CRUCIATE LIGAMENT (ACL) REPAIR WITH ANTERIOR TIBILIAS GRAFT Bilateral 1996, 2003  . BREAST BIOPSY Right 10/27/15   Benign - fat necrosis/post rad changes  . BREAST LUMPECTOMY Right  2014   DCIS - XRT and Chemo  . BUNIONECTOMY Right 2015  . FOOT SURGERY Right 2018  . TONSILLECTOMY    . TOTAL HIP ARTHROPLASTY  2010  . TYMPANOPLASTY      Social History   Tobacco Use  . Smoking status: Former Smoker    Packs/day: 0.75    Years: 50.00    Pack years: 37.50    Types: Cigarettes  . Smokeless tobacco: Current User    Types: Chew  Substance Use Topics  . Alcohol use: Yes    Alcohol/week: 0.0 oz    Comment: wine  . Drug use: No     Medication list has been reviewed and updated.  PHQ 2/9 Scores 10/02/2016 09/18/2015 01/06/2015  PHQ - 2 Score 0 0 0    Physical Exam  Constitutional: She is oriented to person, place, and time. She appears well-developed. No distress.  HENT:  Head: Normocephalic and atraumatic.  Neck: Normal range of motion. Neck supple. No thyromegaly present.  Cardiovascular: Normal rate, regular rhythm and normal heart sounds.  Pulmonary/Chest: Effort normal and breath sounds normal. No respiratory distress. She has no wheezes.  Musculoskeletal: She exhibits edema. She exhibits no tenderness.       Right shoulder: She exhibits decreased range of motion. She exhibits no tenderness and no bony tenderness.       Left shoulder: She exhibits decreased range of motion. She exhibits no tenderness and no bony tenderness.  Tender over both AC joints  Neurological: She is alert and oriented to person, place, and time. She has normal strength.  Skin: Skin is warm and dry. No rash noted.     No calf cord or tenderness Calf equal bilaterally  Psychiatric: She has a normal mood and affect. Her speech is normal and behavior is normal. Thought content normal.  Nursing note and vitals reviewed.   BP 122/78   Pulse (!) 101   Ht 5\' 5"  (1.651 m)   Wt 140 lb 12.8 oz (63.9 kg)   SpO2 97%   BMI 23.43 kg/m   Assessment and Plan: 1. Strain of shoulder, unspecified laterality, initial encounter Continue Aleve, use heat or topical rubs Should gradually  resolve   No orders of the defined types were placed in this encounter.   Partially dictated using Editor, commissioning. Any errors are unintentional.  Halina Maidens, MD [facility]  05/16/2017

## 2017-06-04 DIAGNOSIS — M79671 Pain in right foot: Secondary | ICD-10-CM | POA: Diagnosis not present

## 2017-06-04 DIAGNOSIS — Z981 Arthrodesis status: Secondary | ICD-10-CM | POA: Diagnosis not present

## 2017-06-05 DIAGNOSIS — G629 Polyneuropathy, unspecified: Secondary | ICD-10-CM | POA: Diagnosis not present

## 2017-07-16 DIAGNOSIS — L578 Other skin changes due to chronic exposure to nonionizing radiation: Secondary | ICD-10-CM | POA: Diagnosis not present

## 2017-07-16 DIAGNOSIS — Z85820 Personal history of malignant melanoma of skin: Secondary | ICD-10-CM | POA: Diagnosis not present

## 2017-07-16 DIAGNOSIS — Z86018 Personal history of other benign neoplasm: Secondary | ICD-10-CM | POA: Diagnosis not present

## 2017-07-18 ENCOUNTER — Other Ambulatory Visit: Payer: Self-pay | Admitting: Internal Medicine

## 2017-07-21 ENCOUNTER — Ambulatory Visit (INDEPENDENT_AMBULATORY_CARE_PROVIDER_SITE_OTHER): Payer: Medicare HMO | Admitting: Internal Medicine

## 2017-07-21 ENCOUNTER — Encounter: Payer: Self-pay | Admitting: Internal Medicine

## 2017-07-21 VITALS — BP 134/80 | HR 93 | Ht 65.0 in | Wt 140.0 lb

## 2017-07-21 DIAGNOSIS — I10 Essential (primary) hypertension: Secondary | ICD-10-CM | POA: Diagnosis not present

## 2017-07-21 DIAGNOSIS — R600 Localized edema: Secondary | ICD-10-CM

## 2017-07-21 MED ORDER — LOVASTATIN 40 MG PO TABS: 40.0000 mg | ORAL_TABLET | Freq: Every day | ORAL | 3 refills | Status: DC

## 2017-07-21 MED ORDER — LOSARTAN POTASSIUM 50 MG PO TABS: 50.0000 mg | ORAL_TABLET | Freq: Every day | ORAL | 3 refills | Status: DC

## 2017-07-21 NOTE — Progress Notes (Signed)
Date:  07/21/2017   Name:  Cheyenne Cummings   DOB:  13-May-1945   MRN:  [id number]   Chief Complaint: Joint Swelling (Rt Ankle. Foot surgery in Novemeber. Swelling after. Needs meds refilled. )  Hypertension  This is a chronic problem. The problem is controlled. Pertinent negatives include no chest pain, palpitations or shortness of breath. Past treatments include angiotensin blockers.   Edema - since right foot surgery she has had more edema of her ankle.  She started wearing a compression sleeve and sx are much better.  She actually went all day without the sleeve and still is not swelling.  She had no twisting injury.  No swelling of the left ankle.  Review of Systems  Constitutional: Negative for chills, fatigue and fever.  Respiratory: Negative for cough, chest tightness, shortness of breath and wheezing.   Cardiovascular: Negative for chest pain, palpitations and leg swelling.  Musculoskeletal: Positive for joint swelling (right ankle). Negative for arthralgias and gait problem.    Patient Active Problem List   Diagnosis Date Noted  . Hypothyroidism due to acquired atrophy of thyroid 10/02/2016  . Tinnitus of right ear 05/01/2016  . Thoracic ascending aortic aneurysm ([place]) 01/04/2016  . Ductal carcinoma in situ (DCIS) of right breast 09/18/2015  . Tobacco use disorder 09/18/2015  . Alphaherpesviral disease 10/01/2014  . Hyperlipidemia 10/01/2014  . Anxiety disorder due to known physiological condition 10/01/2014  . Personal history of tobacco use, presenting hazards to health 10/01/2014  . Environmental and seasonal allergies 10/01/2014  . BB block 10/01/2014  . Edema extremities 10/01/2014  . Essential (primary) hypertension 10/01/2014  . History of colon polyps 10/01/2014  . Family history of aneurysm 10/01/2014  . Arthritis of knee, degenerative 10/01/2014  . Addison anemia 10/01/2014  . Leg pain 02/11/2014  . Leg paresthesia 02/11/2014  . Plantar fasciitis 02/11/2014   . Neuropathy due to ionizing radiation ([place]) 02/11/2014    Prior to Admission medications   Medication Sig Start Date End Date Taking? Authorizing Provider  acyclovir (ZOVIRAX) 200 MG capsule Take 1 capsule (200 mg total) by mouth 3 (three) times daily. 06/03/16  Yes Glean Hess, MD  cholecalciferol (VITAMIN D) 1000 UNITS tablet Take 1,000 Units by mouth daily.   Yes [provider]  clonazePAM (KLONOPIN) 0.5 MG tablet Take 0.5 mg by mouth 2 (two) times daily.   Yes [provider]  cyanocobalamin 1000 MCG tablet Take 1 tablet by mouth daily.   Yes [provider]  fexofenadine (ALLEGRA) 180 MG tablet Take 1 tablet by mouth daily.   Yes [provider]  gabapentin (NEURONTIN) 300 MG capsule Take 300 mg by mouth daily.   Yes [provider]  levothyroxine (SYNTHROID, LEVOTHROID) 75 MCG tablet TAKE 1 TABLET DAILY 07/19/17  Yes Glean Hess, MD  losartan (COZAAR) 50 MG tablet TAKE 1 TABLET DAILY 08/02/16  Yes Glean Hess, MD  lovastatin (MEVACOR) 40 MG tablet TAKE 1 TABLET AT BEDTIME 08/02/16  Yes Glean Hess, MD  Multiple Vitamins-Minerals (MULTIVITAMIN WITH MINERALS) tablet Take 1 tablet by mouth daily.   Yes [provider]  Naproxen Sodium 220 MG CAPS Take 1 capsule by mouth 2 (two) times daily as needed.   Yes [provider]  tamoxifen (NOLVADEX) 20 MG tablet TAKE 1 TABLET DAILY 08/30/16  Yes Glean Hess, MD  venlafaxine XR (EFFEXOR-XR) 75 MG 24 hr capsule Take 1 capsule (75 mg total) by mouth daily. 05/06/17  Yes  Glean Hess, MD    Allergies  Allergen Reactions  . Ace Inhibitors Cough    Past Surgical History:  Procedure Laterality Date  . ARTHROSCOPY WITH ANTERIOR CRUCIATE LIGAMENT (ACL) REPAIR WITH ANTERIOR TIBILIAS GRAFT Bilateral 1996, 2003  . BREAST BIOPSY Right 10/27/15   Benign - fat necrosis/post rad changes  . BREAST LUMPECTOMY Right 2014   DCIS - XRT and Chemo  . BUNIONECTOMY Right  2015  . FOOT SURGERY Right 2018  . TONSILLECTOMY    . TOTAL HIP ARTHROPLASTY  2010  . TYMPANOPLASTY      Social History   Tobacco Use  . Smoking status: Former Smoker    Packs/day: 0.75    Years: 50.00    Pack years: 37.50    Types: Cigarettes  . Smokeless tobacco: Never Used  Substance Use Topics  . Alcohol use: Yes    Alcohol/week: 0.0 oz    Comment: wine  . Drug use: No     Medication list has been reviewed and updated.  PHQ 2/9 Scores 10/02/2016 09/18/2015 01/06/2015  PHQ - 2 Score 0 0 0    Physical Exam  Constitutional: She is oriented to person, place, and time. She appears well-developed. No distress.  HENT:  Head: Normocephalic and atraumatic.  Neck: Normal range of motion. Neck supple.  Cardiovascular: Normal rate, regular rhythm and normal heart sounds.  Pulmonary/Chest: Effort normal and breath sounds normal. No respiratory distress. She has no wheezes.  Musculoskeletal: Normal range of motion. She exhibits no edema.  Neurological: She is alert and oriented to person, place, and time.  Skin: Skin is warm and dry. No rash noted.  Psychiatric: She has a normal mood and affect. Her behavior is normal. Thought content normal.  Nursing note and vitals reviewed.   BP 134/80   Pulse 93   Ht 5\' 5"  (1.651 m)   Wt 140 lb (63.5 kg)   SpO2 98%   BMI 23.30 kg/m   Assessment and Plan: 1. Edema extremities Improved; pt advised it may take a year to fully recover from orthopedic surgery Continue compression sleeve  2. Essential (primary) hypertension Controlled Losartan refilled   Meds ordered this encounter  Medications  . losartan (COZAAR) 50 MG tablet    Sig: Take 1 tablet (50 mg total) by mouth daily.    Dispense:  90 tablet    Refill:  3  . lovastatin (MEVACOR) 40 MG tablet    Sig: Take 1 tablet (40 mg total) by mouth at bedtime.    Dispense:  90 tablet    Refill:  3    Partially dictated using Editor, commissioning. Any errors are  unintentional.  Halina Maidens, MD [facility]  07/21/2017

## 2017-08-04 ENCOUNTER — Telehealth: Payer: Self-pay

## 2017-08-04 NOTE — Telephone Encounter (Signed)
10/16/17 AWV rescheduled to 10/22/17 d/t program no longer available on Thursdays.

## 2017-08-25 ENCOUNTER — Other Ambulatory Visit: Payer: Self-pay | Admitting: Internal Medicine

## 2017-08-25 ENCOUNTER — Telehealth: Payer: Self-pay

## 2017-08-25 MED ORDER — TAMOXIFEN CITRATE 20 MG PO TABS: 20.0000 mg | ORAL_TABLET | Freq: Every day | ORAL | 3 refills | Status: DC

## 2017-08-25 NOTE — Telephone Encounter (Signed)
Refill of Tamoxifen 20 mg to mail order

## 2017-09-08 DIAGNOSIS — E785 Hyperlipidemia, unspecified: Secondary | ICD-10-CM | POA: Diagnosis not present

## 2017-09-08 DIAGNOSIS — T182XXA Foreign body in stomach, initial encounter: Secondary | ICD-10-CM | POA: Diagnosis not present

## 2017-09-08 DIAGNOSIS — X58XXXA Exposure to other specified factors, initial encounter: Secondary | ICD-10-CM | POA: Diagnosis not present

## 2017-09-08 DIAGNOSIS — R69 Illness, unspecified: Secondary | ICD-10-CM | POA: Diagnosis not present

## 2017-09-08 DIAGNOSIS — Z79899 Other long term (current) drug therapy: Secondary | ICD-10-CM | POA: Diagnosis not present

## 2017-09-08 DIAGNOSIS — E039 Hypothyroidism, unspecified: Secondary | ICD-10-CM | POA: Diagnosis not present

## 2017-09-08 DIAGNOSIS — T189XXA Foreign body of alimentary tract, part unspecified, initial encounter: Secondary | ICD-10-CM | POA: Diagnosis not present

## 2017-09-08 DIAGNOSIS — Z888 Allergy status to other drugs, medicaments and biological substances status: Secondary | ICD-10-CM | POA: Diagnosis not present

## 2017-09-09 DIAGNOSIS — E785 Hyperlipidemia, unspecified: Secondary | ICD-10-CM | POA: Diagnosis not present

## 2017-09-09 DIAGNOSIS — T189XXA Foreign body of alimentary tract, part unspecified, initial encounter: Secondary | ICD-10-CM | POA: Diagnosis not present

## 2017-09-09 DIAGNOSIS — X58XXXA Exposure to other specified factors, initial encounter: Secondary | ICD-10-CM | POA: Diagnosis not present

## 2017-09-09 DIAGNOSIS — Z79899 Other long term (current) drug therapy: Secondary | ICD-10-CM | POA: Diagnosis not present

## 2017-09-09 DIAGNOSIS — R69 Illness, unspecified: Secondary | ICD-10-CM | POA: Diagnosis not present

## 2017-09-09 DIAGNOSIS — E039 Hypothyroidism, unspecified: Secondary | ICD-10-CM | POA: Diagnosis not present

## 2017-09-09 DIAGNOSIS — Z888 Allergy status to other drugs, medicaments and biological substances status: Secondary | ICD-10-CM | POA: Diagnosis not present

## 2017-09-24 ENCOUNTER — Other Ambulatory Visit: Payer: Self-pay | Admitting: Internal Medicine

## 2017-10-03 ENCOUNTER — Telehealth: Payer: Self-pay | Admitting: Internal Medicine

## 2017-10-03 NOTE — Telephone Encounter (Signed)
Called to resched awv

## 2017-10-07 ENCOUNTER — Telehealth: Payer: Self-pay | Admitting: *Deleted

## 2017-10-07 DIAGNOSIS — Z87891 Personal history of nicotine dependence: Secondary | ICD-10-CM

## 2017-10-07 DIAGNOSIS — Z122 Encounter for screening for malignant neoplasm of respiratory organs: Secondary | ICD-10-CM

## 2017-10-07 NOTE — Telephone Encounter (Signed)
Notified patient that annual lung cancer screening low dose CT scan is due currently or will be in near future. Confirmed that patient is within the age range of 55-77, and asymptomatic, (no signs or symptoms of lung cancer). Patient denies illness that would prevent curative treatment for lung cancer if found. Verified smoking history, (current, 38 pack year). The shared decision making visit was done 01/02/16. Patient is agreeable for CT scan being scheduled.

## 2017-10-09 ENCOUNTER — Ambulatory Visit: Payer: Medicare HMO

## 2017-10-16 ENCOUNTER — Ambulatory Visit: Payer: Medicare HMO

## 2017-10-20 ENCOUNTER — Encounter: Payer: Medicare HMO | Admitting: Internal Medicine

## 2017-10-22 ENCOUNTER — Ambulatory Visit: Payer: Self-pay

## 2017-10-28 DIAGNOSIS — R29818 Other symptoms and signs involving the nervous system: Secondary | ICD-10-CM | POA: Diagnosis not present

## 2017-10-31 ENCOUNTER — Ambulatory Visit
Admission: RE | Admit: 2017-10-31 | Discharge: 2017-10-31 | Disposition: A | Discharge status: Home / Self Care | Payer: Medicare HMO | Source: Ambulatory Visit | Attending: Nurse Practitioner | Admitting: Nurse Practitioner

## 2017-10-31 DIAGNOSIS — Z122 Encounter for screening for malignant neoplasm of respiratory organs: Secondary | ICD-10-CM | POA: Insufficient documentation

## 2017-10-31 DIAGNOSIS — J439 Emphysema, unspecified: Secondary | ICD-10-CM | POA: Diagnosis not present

## 2017-10-31 DIAGNOSIS — Z87891 Personal history of nicotine dependence: Secondary | ICD-10-CM

## 2017-10-31 DIAGNOSIS — R69 Illness, unspecified: Secondary | ICD-10-CM | POA: Diagnosis not present

## 2017-10-31 DIAGNOSIS — I77810 Thoracic aortic ectasia: Secondary | ICD-10-CM | POA: Insufficient documentation

## 2017-10-31 DIAGNOSIS — I700 Atherosclerosis of aorta: Secondary | ICD-10-CM | POA: Diagnosis not present

## 2017-11-02 NOTE — Progress Notes (Signed)
[facility]  [phone] [phone]  ID: Cheyenne Cummings OB: 1945/11/02  MR#: [id number]  HBZ#:[id number]  Patient Care Team: Glean Hess, MD as PCP - General (Internal Medicine) Lloyd Huger, MD as Consulting Physician (Oncology) Hobson-Webb, Preston Fleeting, MD as Referring Physician (Neurology)  CHIEF COMPLAINT: Right breast DCIS.  INTERVAL HISTORY: Patient returns to clinic today for routine yearly follow-up.  She continues to tolerate tamoxifen well without significant side effects.  She currently feels well and is asymptomatic. She has no neurologic complaints. She denies any recent fevers or illnesses.  She has a good appetite and denies weight loss.  She has no bony pain.  She has no chest pain or shortness of breath. She denies any nausea, vomiting, constipation, or diarrhea. She has no urinary complaints.  Patient feels at her baseline offers no specific complaints today.    REVIEW OF SYSTEMS:   Review of Systems  Constitutional: Negative.  Negative for fever, malaise/fatigue and weight loss.  Respiratory: Negative.  Negative for cough and shortness of breath.   Cardiovascular: Negative.  Negative for chest pain and leg swelling.  Gastrointestinal: Negative.  Negative for abdominal pain and constipation.  Genitourinary: Negative.  Negative for dysuria.  Musculoskeletal: Negative.  Negative for myalgias.  Skin: Negative.  Negative for rash.  Neurological: Negative.  Negative for focal weakness, weakness and headaches.  Psychiatric/Behavioral: Negative.  The patient is not nervous/anxious.     As per HPI. Otherwise, a complete review of systems is negative.  PAST MEDICAL HISTORY: Past Medical History:  Diagnosis Date  . Anxiety disorder   . Breast cancer ([facility]) 01/27/2013   right DCIS lumpectomy, radiation   . Edema extremities   . Herpes simplex   . Hyperlipidemia   . Hypertension   . Hypothyroidism   . Osteoarthritis   .  Personal history of radiation therapy     PAST SURGICAL HISTORY: Past Surgical History:  Procedure Laterality Date  . ARTHROSCOPY WITH ANTERIOR CRUCIATE LIGAMENT (ACL) REPAIR WITH ANTERIOR TIBILIAS GRAFT Bilateral 1996, 2003  . BREAST BIOPSY Right 10/27/15   Benign - fat necrosis/post rad changes  . BREAST LUMPECTOMY Right 2014   DCIS - XRT and Chemo  . BUNIONECTOMY Right 2015  . FOOT SURGERY Right 2018  . TONSILLECTOMY    . TOTAL HIP ARTHROPLASTY  2010  . TYMPANOPLASTY      FAMILY HISTORY Family History  Problem Relation Age of Onset  . Cancer Maternal Grandmother   . Breast cancer Other 32       niece       ADVANCED DIRECTIVES:    HEALTH MAINTENANCE: Social History   Tobacco Use  . Smoking status: Current Every Day Smoker    Packs/day: 0.50    Years: 50.00    Pack years: 25.00    Types: Cigarettes  . Smokeless tobacco: Never Used  . Tobacco comment: start back 07/2017  Substance Use Topics  . Alcohol use: Yes    Alcohol/week: 0.0 oz    Comment: wine  . Drug use: No     Colonoscopy:  PAP:  Bone density:  Lipid panel:  Allergies  Allergen Reactions  . Ace Inhibitors Cough    Current Outpatient Medications  Medication Sig Dispense Refill  . cholecalciferol (VITAMIN D) 1000 UNITS tablet Take 1,000 Units by mouth daily.    . clonazePAM (KLONOPIN) 0.5 MG tablet Take 0.5 mg by mouth 2 (two) times daily.    . cyanocobalamin 1000 MCG tablet  Take 1 tablet by mouth daily.    . fexofenadine (ALLEGRA) 180 MG tablet Take 1 tablet by mouth daily.    Marland Kitchen gabapentin (NEURONTIN) 300 MG capsule Take 300 mg by mouth daily.    Marland Kitchen levothyroxine (SYNTHROID, LEVOTHROID) 75 MCG tablet TAKE 1 TABLET DAILY 90 tablet 3  . losartan (COZAAR) 50 MG tablet Take 1 tablet (50 mg total) by mouth daily. 90 tablet 3  . lovastatin (MEVACOR) 40 MG tablet Take 1 tablet (40 mg total) by mouth at bedtime. 90 tablet 3  . Multiple Vitamins-Minerals (MULTIVITAMIN WITH MINERALS) tablet Take 1  tablet by mouth daily.    . Naproxen Sodium 220 MG CAPS Take 1 capsule by mouth 2 (two) times daily as needed.    . tamoxifen (NOLVADEX) 20 MG tablet Take 1 tablet (20 mg total) by mouth daily. 90 tablet 3  . venlafaxine XR (EFFEXOR-XR) 75 MG 24 hr capsule Take 1 capsule (75 mg total) by mouth daily. 90 capsule 0  . acyclovir (ZOVIRAX) 200 MG capsule Take 1 capsule (200 mg total) by mouth 3 (three) times daily. (Patient not taking: Reported on 11/07/2017) 30 capsule 5   No current facility-administered medications for this visit.     OBJECTIVE: Vitals:   11/07/17 1038  BP: 140/86  Pulse: 86  Resp: 18  Temp: 97.8 F (36.6 C)     Body mass index is 24.07 kg/m.    ECOG FS:0 - Asymptomatic  General: Well-developed, well-nourished, no acute distress. Eyes: Pink conjunctiva, anicteric sclera. Breast: Bilateral breast and axilla without lumps or masses. Lungs: Clear to auscultation bilaterally. Heart: Regular rate and rhythm. No rubs, murmurs, or gallops. Abdomen: Soft, nontender, nondistended. No organomegaly noted, normoactive bowel sounds. Musculoskeletal: No edema, cyanosis, or clubbing. Neuro: Alert, answering all questions appropriately. Cranial nerves grossly intact. Skin: No rashes or petechiae noted. Psych: Normal affect.  LAB RESULTS:  Lab Results  Component Value Date   NA 138 11/06/2017   K 4.6 11/06/2017   CL 98 11/06/2017   CO2 23 11/06/2017   GLUCOSE 72 11/06/2017   BUN 10 11/06/2017   CREATININE 0.63 11/06/2017   CALCIUM 9.5 11/06/2017   PROT 7.1 11/06/2017   ALBUMIN 4.4 11/06/2017   AST 26 11/06/2017   ALT 19 11/06/2017   ALKPHOS 51 11/06/2017   BILITOT 0.4 11/06/2017   GFRNONAA 91 11/06/2017   GFRAA 104 11/06/2017    Lab Results  Component Value Date   WBC 4.2 11/06/2017   NEUTROABS 2.7 11/06/2017   HGB 13.6 11/06/2017   HCT 40.8 11/06/2017   MCV 94 11/06/2017   PLT 232 11/06/2017     STUDIES: Ct Chest Lung Cancer Screening Low Dose Wo  Contrast  Result Date: 10/31/2017 CLINICAL DATA:  72 year old asymptomatic female current smoker with 30 pack-year smoking history. History of right breast cancer. EXAM: CT CHEST WITHOUT CONTRAST LOW-DOSE FOR LUNG CANCER SCREENING TECHNIQUE: Multidetector CT imaging of the chest was performed following the standard protocol without IV contrast. COMPARISON:  01/02/2016 screening chest CT. FINDINGS: Cardiovascular: Normal heart size. No significant pericardial effusion/thickening. Mildly atherosclerotic thoracic aorta with stable ectatic 4.0 cm ascending thoracic aorta using similar measurement technique. Normal caliber pulmonary arteries. Mediastinum/Nodes: No discrete thyroid nodules. Unremarkable esophagus. No pathologically enlarged axillary, mediastinal or hilar lymph nodes, noting limited sensitivity for the detection of hilar adenopathy on this noncontrast study. Stable coarsely calcified subcarinal and right hilar nodes from prior granulomatous disease. Lungs/Pleura: No pneumothorax. No pleural effusion. Mild centrilobular emphysema. No acute consolidative  airspace disease or lung masses. Stable calcified right lower subcentimeter granulomas. No significant pulmonary nodules. Upper abdomen: No acute abnormality. Stable granulomatous splenic calcifications. Musculoskeletal: No aggressive appearing focal osseous lesions. Moderate thoracic spondylosis. IMPRESSION: 1. Lung-RADS 1, negative. Continue annual screening with low-dose chest CT without contrast in 12 months. 2. Stable ectatic 4.0 cm ascending thoracic aorta. Aortic Atherosclerosis (ICD10-I70.0) and Emphysema (ICD10-J43.9). Electronically Signed   By: Ilona Sorrel M.D.   On: 10/31/2017 11:22    ASSESSMENT: Right breast DCIS:  PLAN:    1. Right breast DCIS: Patient had partial mastectomy on February 25, 2013. She subsequently completed XRT and initiated tamoxifen in January 2015. She will complete her 5 years of tamoxifen in January 2020. Patient  had a right breast biopsy on October 30, 2015 that was negative for malignancy.  Her most recent mammogram on February 06, 2017 was reported as BI-RADS 2.  Repeat in October 2019.  Return to clinic in 1 year for further evaluation at which point can consider discharging patient from clinic. 2.  Lung cancer screening: Low-dose CT scan results as above.  Repeat in 1 year.  I spent a total of 20 minutes face-to-face with the patient of which greater than 50% of the visit was spent in counseling and coordination of care as detailed above.  Patient expressed understanding and was in agreement with this plan. She also understands that She can call clinic at any time with any questions, concerns, or complaints.    Lloyd Huger, MD   11/13/2017 1:37 PM

## 2017-11-03 ENCOUNTER — Encounter: Payer: Self-pay | Admitting: *Deleted

## 2017-11-04 DIAGNOSIS — M542 Cervicalgia: Secondary | ICD-10-CM | POA: Diagnosis not present

## 2017-11-06 ENCOUNTER — Encounter: Payer: Self-pay | Admitting: Internal Medicine

## 2017-11-06 ENCOUNTER — Ambulatory Visit (INDEPENDENT_AMBULATORY_CARE_PROVIDER_SITE_OTHER): Payer: Medicare HMO | Admitting: Internal Medicine

## 2017-11-06 ENCOUNTER — Other Ambulatory Visit: Payer: Self-pay

## 2017-11-06 VITALS — BP 116/86 | HR 86 | Temp 98.9°F | Ht 64.0 in | Wt 138.0 lb

## 2017-11-06 DIAGNOSIS — D0511 Intraductal carcinoma in situ of right breast: Secondary | ICD-10-CM

## 2017-11-06 DIAGNOSIS — E034 Atrophy of thyroid (acquired): Secondary | ICD-10-CM

## 2017-11-06 DIAGNOSIS — R69 Illness, unspecified: Secondary | ICD-10-CM | POA: Diagnosis not present

## 2017-11-06 DIAGNOSIS — Z23 Encounter for immunization: Secondary | ICD-10-CM

## 2017-11-06 DIAGNOSIS — M85851 Other specified disorders of bone density and structure, right thigh: Secondary | ICD-10-CM | POA: Insufficient documentation

## 2017-11-06 DIAGNOSIS — F17200 Nicotine dependence, unspecified, uncomplicated: Secondary | ICD-10-CM

## 2017-11-06 DIAGNOSIS — I10 Essential (primary) hypertension: Secondary | ICD-10-CM

## 2017-11-06 DIAGNOSIS — Z0001 Encounter for general adult medical examination with abnormal findings: Secondary | ICD-10-CM | POA: Diagnosis not present

## 2017-11-06 DIAGNOSIS — G6282 Radiation-induced polyneuropathy: Secondary | ICD-10-CM | POA: Diagnosis not present

## 2017-11-06 DIAGNOSIS — Z0000 Encounter for general adult medical examination without abnormal findings: Secondary | ICD-10-CM

## 2017-11-06 DIAGNOSIS — F064 Anxiety disorder due to known physiological condition: Secondary | ICD-10-CM

## 2017-11-06 DIAGNOSIS — E782 Mixed hyperlipidemia: Secondary | ICD-10-CM | POA: Diagnosis not present

## 2017-11-06 LAB — POCT URINALYSIS DIPSTICK
Bilirubin, UA: NEGATIVE
Blood, UA: NEGATIVE
Glucose, UA: NEGATIVE
LEUKOCYTES UA: NEGATIVE
NITRITE UA: NEGATIVE
PH UA: 5 (ref 5.0–8.0)
PROTEIN UA: NEGATIVE
Spec Grav, UA: <=1.005 — AB (ref 1.010–1.025)
UROBILINOGEN UA: 0.2 U/dL

## 2017-11-06 MED ORDER — ZOSTER VAC RECOMB ADJUVANTED 50 MCG/0.5ML IM SUSR
0.5000 mL | Freq: Once | INTRAMUSCULAR | 1 refills | Status: AC
Start: 2017-11-06 — End: 2017-11-06

## 2017-11-06 NOTE — Progress Notes (Signed)
Date:  11/06/2017   Name:  Cheyenne Cummings   DOB:  1946-03-25   MRN:  [id number]   Chief Complaint: No chief complaint on file. Cheyenne Cummings is a 72 y.o. female who presents today for her Complete Annual Exam. She feels well. She reports exercising walking. She reports she is sleeping well.  She has follow up on DCIS of her right breast tomorrow with Oncology.  Hypertension  Associated symptoms include anxiety. Pertinent negatives include no chest pain, headaches, palpitations or shortness of breath. Identifiable causes of hypertension include a thyroid problem.  Hyperlipidemia  Pertinent negatives include no chest pain or shortness of breath.  Thyroid Problem  Symptoms include anxiety. Patient reports no constipation, diarrhea, fatigue, palpitations or tremors. Her past medical history is significant for hyperlipidemia.  Anxiety  Presents for follow-up visit. Symptoms include nervous/anxious behavior. Patient reports no chest pain, dizziness, palpitations or shortness of breath. The quality of sleep is good.   Compliance with medications is 76-100% (effexor).   Neuropathy - doing fairly well, on medication and followed by neurology/oncology.  On gabapentin and clonazepam. Tobacco - she has started back smoking.  She knows she needs to quit again but her husband also smokes.  She can't really afford Chantix.  She did have CT screening that was negative except for stable aortic dilatation 4.0 cm without true aneurysm. Osteopenia - DEXA 2017 showed osteopenia of the hip, normal spine and forearm.    Review of Systems  Constitutional: Negative for chills, fatigue and fever.  HENT: Negative for congestion, hearing loss, tinnitus, trouble swallowing and voice change.   Eyes: Negative for visual disturbance.  Respiratory: Negative for cough, chest tightness, shortness of breath and wheezing.   Cardiovascular: Positive for leg swelling. Negative for chest pain and palpitations.    Gastrointestinal: Negative for abdominal pain, constipation, diarrhea and vomiting.  Endocrine: Negative for polydipsia and polyuria.  Genitourinary: Negative for dysuria, frequency, genital sores, vaginal bleeding and vaginal discharge.  Musculoskeletal: Positive for arthralgias (ankle and neck). Negative for gait problem and joint swelling.  Skin: Negative for color change and rash.  Neurological: Negative for dizziness, tremors, light-headedness and headaches.  Hematological: Negative for adenopathy. Does not bruise/bleed easily.  Psychiatric/Behavioral: Negative for dysphoric mood and sleep disturbance. The patient is nervous/anxious.     Patient Active Problem List   Diagnosis Date Noted  . Hypothyroidism due to acquired atrophy of thyroid 10/02/2016  . Tinnitus of right ear 05/01/2016  . Thoracic ascending aortic aneurysm ([place]) 01/04/2016  . Ductal carcinoma in situ (DCIS) of right breast 09/18/2015  . Tobacco use disorder 09/18/2015  . Alphaherpesviral disease 10/01/2014  . Hyperlipidemia 10/01/2014  . Anxiety disorder due to known physiological condition 10/01/2014  . Personal history of tobacco use, presenting hazards to health 10/01/2014  . Environmental and seasonal allergies 10/01/2014  . BB block 10/01/2014  . Edema extremities 10/01/2014  . Essential (primary) hypertension 10/01/2014  . History of colon polyps 10/01/2014  . Family history of aneurysm 10/01/2014  . Arthritis of knee, degenerative 10/01/2014  . Addison anemia 10/01/2014  . Leg pain 02/11/2014  . Leg paresthesia 02/11/2014  . Plantar fasciitis 02/11/2014  . Neuropathy due to ionizing radiation ([place]) 02/11/2014    Prior to Admission medications   Medication Sig Start Date End Date Taking? Authorizing Provider  acyclovir (ZOVIRAX) 200 MG capsule Take 1 capsule (200 mg total) by mouth 3 (three) times daily. 06/03/16  Yes Glean Hess, MD  cholecalciferol (VITAMIN  D) 1000 UNITS tablet Take 1,000  Units by mouth daily.   Yes [provider]  clonazePAM (KLONOPIN) 0.5 MG tablet Take 0.5 mg by mouth 2 (two) times daily.   Yes [provider]  cyanocobalamin 1000 MCG tablet Take 1 tablet by mouth daily.   Yes [provider]  fexofenadine (ALLEGRA) 180 MG tablet Take 1 tablet by mouth daily.   Yes [provider]  gabapentin (NEURONTIN) 300 MG capsule Take 300 mg by mouth daily.   Yes [provider]  levothyroxine (SYNTHROID, LEVOTHROID) 75 MCG tablet TAKE 1 TABLET DAILY 07/19/17  Yes Glean Hess, MD  losartan (COZAAR) 50 MG tablet Take 1 tablet (50 mg total) by mouth daily. 07/21/17  Yes Glean Hess, MD  lovastatin (MEVACOR) 40 MG tablet Take 1 tablet (40 mg total) by mouth at bedtime. 07/21/17  Yes Glean Hess, MD  Multiple Vitamins-Minerals (MULTIVITAMIN WITH MINERALS) tablet Take 1 tablet by mouth daily.   Yes [provider]  Naproxen Sodium 220 MG CAPS Take 1 capsule by mouth 2 (two) times daily as needed.   Yes [provider]  tamoxifen (NOLVADEX) 20 MG tablet Take 1 tablet (20 mg total) by mouth daily. 08/25/17  Yes Glean Hess, MD  venlafaxine XR (EFFEXOR-XR) 75 MG 24 hr capsule Take 1 capsule (75 mg total) by mouth daily. 09/24/17  Yes Glean Hess, MD    Allergies  Allergen Reactions  . Ace Inhibitors Cough    Past Surgical History:  Procedure Laterality Date  . ARTHROSCOPY WITH ANTERIOR CRUCIATE LIGAMENT (ACL) REPAIR WITH ANTERIOR TIBILIAS GRAFT Bilateral 1996, 2003  . BREAST BIOPSY Right 10/27/15   Benign - fat necrosis/post rad changes  . BREAST LUMPECTOMY Right 2014   DCIS - XRT and Chemo  . BUNIONECTOMY Right 2015  . FOOT SURGERY Right 2018  . TONSILLECTOMY    . TOTAL HIP ARTHROPLASTY  2010  . TYMPANOPLASTY      Social History   Tobacco Use  . Smoking status: Current Every Day Smoker    Packs/day: 0.50    Years: 50.00    Pack years: 25.00    Types: Cigarettes  .  Smokeless tobacco: Never Used  . Tobacco comment: start back 07/2017  Substance Use Topics  . Alcohol use: Yes    Alcohol/week: 0.0 oz    Comment: wine  . Drug use: No     Medication list has been reviewed and updated.  Current Meds  Medication Sig  . acyclovir (ZOVIRAX) 200 MG capsule Take 1 capsule (200 mg total) by mouth 3 (three) times daily.  . cholecalciferol (VITAMIN D) 1000 UNITS tablet Take 1,000 Units by mouth daily.  . clonazePAM (KLONOPIN) 0.5 MG tablet Take 0.5 mg by mouth 2 (two) times daily.  . cyanocobalamin 1000 MCG tablet Take 1 tablet by mouth daily.  . fexofenadine (ALLEGRA) 180 MG tablet Take 1 tablet by mouth daily.  Marland Kitchen gabapentin (NEURONTIN) 300 MG capsule Take 300 mg by mouth daily.  Marland Kitchen levothyroxine (SYNTHROID, LEVOTHROID) 75 MCG tablet TAKE 1 TABLET DAILY  . losartan (COZAAR) 50 MG tablet Take 1 tablet (50 mg total) by mouth daily.  Marland Kitchen lovastatin (MEVACOR) 40 MG tablet Take 1 tablet (40 mg total) by mouth at bedtime.  . Multiple Vitamins-Minerals (MULTIVITAMIN WITH MINERALS) tablet Take 1 tablet by mouth daily.  . Naproxen Sodium 220 MG CAPS Take 1 capsule by mouth 2 (two) times daily as needed.  . tamoxifen (NOLVADEX) 20 MG  tablet Take 1 tablet (20 mg total) by mouth daily.  Marland Kitchen venlafaxine XR (EFFEXOR-XR) 75 MG 24 hr capsule Take 1 capsule (75 mg total) by mouth daily.    PHQ 2/9 Scores 11/06/2017 10/02/2016 09/18/2015 01/06/2015  PHQ - 2 Score 0 0 0 0    Physical Exam  Constitutional: She is oriented to person, place, and time. She appears well-developed and well-nourished. No distress.  HENT:  Head: Normocephalic and atraumatic.  Right Ear: Tympanic membrane and ear canal normal.  Left Ear: Tympanic membrane and ear canal normal.  Nose: Right sinus exhibits no maxillary sinus tenderness. Left sinus exhibits no maxillary sinus tenderness.  Mouth/Throat: Uvula is midline and oropharynx is clear and moist.  Eyes: Conjunctivae and EOM are normal. Right eye  exhibits no discharge. Left eye exhibits no discharge. No scleral icterus.  Neck: No spinous process tenderness present. Carotid bruit is not present. No erythema and normal range of motion present. No thyromegaly present.  Cardiovascular: Normal rate, regular rhythm, normal heart sounds and normal pulses.  Pulmonary/Chest: Effort normal. No respiratory distress. She has no wheezes. Right breast exhibits skin change. Right breast exhibits no mass, no nipple discharge and no tenderness. Left breast exhibits no mass, no nipple discharge, no skin change and no tenderness.    Abdominal: Soft. Bowel sounds are normal. There is no hepatosplenomegaly. There is no tenderness. There is no CVA tenderness.  Musculoskeletal: Normal range of motion.       Right knee: She exhibits no swelling and no effusion.       Left knee: She exhibits no swelling and no effusion.       Right ankle: She exhibits swelling (mild).  Lymphadenopathy:    She has no cervical adenopathy.    She has no axillary adenopathy.  Neurological: She is alert and oriented to person, place, and time. She has normal strength and normal reflexes. No cranial nerve deficit.  Skin: Skin is warm, dry and intact. No rash noted.     Psychiatric: She has a normal mood and affect. Her speech is normal and behavior is normal. Thought content normal. She expresses no suicidal ideation. She expresses no suicidal plans.  Nursing note and vitals reviewed.   BP 116/86   Pulse 86   Temp 98.9 F (37.2 C)   Ht 5\' 4"  (1.626 m)   Wt 138 lb (62.6 kg)   SpO2 99%   BMI 23.69 kg/m   Assessment and Plan: 1. Annual physical exam Pt encouraged to continue exercise, healthy diet - POCT urinalysis dipstick  3. Essential (primary) hypertension controlled - CBC with Differential/Platelet - Comprehensive metabolic panel  4. Hypothyroidism due to acquired atrophy of thyroid Supplemented, adjust dose if needed - TSH  5. Mixed hyperlipidemia On  statin therapy - Lipid panel  6. Neuropathy due to ionizing radiation (HCC) Stable chronic sx on medication  7. Tobacco use disorder Discussed options - pt wants to try to quit on her own  8. Ductal carcinoma in situ (DCIS) of right breast Followed by oncology  9. Need for shingles vaccine - Zoster Vaccine Adjuvanted [facility]) injection; Inject 0.5 mLs into the muscle once for 1 dose.  Dispense: 0.5 mL; Refill: 1  10. Osteopenia of right hip Due for 2 year repeat - DG Bone Density; Future   Meds ordered this encounter  Medications  . Zoster Vaccine Adjuvanted [facility]) injection    Sig: Inject 0.5 mLs into the muscle once for 1 dose.    Dispense:  0.5 mL    Refill:  1    Partially dictated using Editor, commissioning. Any errors are unintentional.  Halina Maidens, MD [facility]  11/06/2017   There are no diagnoses linked to this encounter.

## 2017-11-07 ENCOUNTER — Inpatient Hospital Stay: Payer: Medicare HMO | Attending: Oncology | Admitting: Oncology

## 2017-11-07 ENCOUNTER — Other Ambulatory Visit: Payer: Self-pay

## 2017-11-07 VITALS — BP 140/86 | HR 86 | Temp 97.8°F | Resp 18 | Wt 140.2 lb

## 2017-11-07 DIAGNOSIS — Z170 Estrogen receptor positive status [ER+]: Secondary | ICD-10-CM | POA: Diagnosis not present

## 2017-11-07 DIAGNOSIS — F17210 Nicotine dependence, cigarettes, uncomplicated: Secondary | ICD-10-CM | POA: Insufficient documentation

## 2017-11-07 DIAGNOSIS — Z79899 Other long term (current) drug therapy: Secondary | ICD-10-CM | POA: Diagnosis not present

## 2017-11-07 DIAGNOSIS — F419 Anxiety disorder, unspecified: Secondary | ICD-10-CM | POA: Diagnosis not present

## 2017-11-07 DIAGNOSIS — I10 Essential (primary) hypertension: Secondary | ICD-10-CM | POA: Insufficient documentation

## 2017-11-07 DIAGNOSIS — E785 Hyperlipidemia, unspecified: Secondary | ICD-10-CM | POA: Diagnosis not present

## 2017-11-07 DIAGNOSIS — I700 Atherosclerosis of aorta: Secondary | ICD-10-CM | POA: Insufficient documentation

## 2017-11-07 DIAGNOSIS — M1990 Unspecified osteoarthritis, unspecified site: Secondary | ICD-10-CM | POA: Insufficient documentation

## 2017-11-07 DIAGNOSIS — D0511 Intraductal carcinoma in situ of right breast: Secondary | ICD-10-CM | POA: Insufficient documentation

## 2017-11-07 DIAGNOSIS — Z79810 Long term (current) use of selective estrogen receptor modulators (SERMs): Secondary | ICD-10-CM

## 2017-11-07 DIAGNOSIS — E039 Hypothyroidism, unspecified: Secondary | ICD-10-CM | POA: Diagnosis not present

## 2017-11-07 DIAGNOSIS — J439 Emphysema, unspecified: Secondary | ICD-10-CM | POA: Insufficient documentation

## 2017-11-07 DIAGNOSIS — Z923 Personal history of irradiation: Secondary | ICD-10-CM | POA: Diagnosis not present

## 2017-11-07 LAB — LIPID PANEL
Chol/HDL Ratio: 2.2 ratio (ref 0.0–4.4)
Cholesterol, Total: 158 mg/dL (ref 100–199)
HDL: 71 mg/dL (ref >39)
LDL Calculated: 59 mg/dL (ref 0–99)
TRIGLYCERIDES: 138 mg/dL (ref 0–149)
VLDL Cholesterol Cal: 28 mg/dL (ref 5–40)

## 2017-11-07 LAB — CBC WITH DIFFERENTIAL/PLATELET
Basophils Absolute: 0 10*3/uL (ref 0.0–0.2)
Basos: 0 %
EOS (ABSOLUTE): 0.1 10*3/uL (ref 0.0–0.4)
Eos: 2 %
Hematocrit: 40.8 % (ref 34.0–46.6)
Hemoglobin: 13.6 g/dL (ref 11.1–15.9)
Immature Grans (Abs): 0 10*3/uL (ref 0.0–0.1)
Immature Granulocytes: 0 %
Lymphocytes Absolute: 0.9 10*3/uL (ref 0.7–3.1)
Lymphs: 22 %
MCH: 31.5 pg (ref 26.6–33.0)
MCHC: 33.3 g/dL (ref 31.5–35.7)
MCV: 94 fL (ref 79–97)
Monocytes Absolute: 0.4 10*3/uL (ref 0.1–0.9)
Monocytes: 10 %
Neutrophils Absolute: 2.7 10*3/uL (ref 1.4–7.0)
Neutrophils: 66 %
Platelets: 232 10*3/uL (ref 150–450)
RBC: 4.32 x10E6/uL (ref 3.77–5.28)
RDW: 15.4 % (ref 12.3–15.4)
WBC: 4.2 10*3/uL (ref 3.4–10.8)

## 2017-11-07 LAB — COMPREHENSIVE METABOLIC PANEL WITH GFR
ALT: 19 [IU]/L (ref 0–32)
AST: 26 [IU]/L (ref 0–40)
Albumin/Globulin Ratio: 1.6 (ref 1.2–2.2)
Albumin: 4.4 g/dL (ref 3.5–4.8)
Alkaline Phosphatase: 51 [IU]/L (ref 39–117)
BUN/Creatinine Ratio: 16 (ref 12–28)
BUN: 10 mg/dL (ref 8–27)
Bilirubin Total: 0.4 mg/dL (ref 0.0–1.2)
CO2: 23 mmol/L (ref 20–29)
Calcium: 9.5 mg/dL (ref 8.7–10.3)
Chloride: 98 mmol/L (ref 96–106)
Creatinine, Ser: 0.63 mg/dL (ref 0.57–1.00)
GFR calc Af Amer: 104 mL/min/{1.73_m2}
GFR calc non Af Amer: 91 mL/min/{1.73_m2}
Globulin, Total: 2.7 g/dL (ref 1.5–4.5)
Glucose: 72 mg/dL (ref 65–99)
Potassium: 4.6 mmol/L (ref 3.5–5.2)
Sodium: 138 mmol/L (ref 134–144)
Total Protein: 7.1 g/dL (ref 6.0–8.5)

## 2017-11-07 LAB — TSH: TSH: 2.37 u[IU]/mL (ref 0.450–4.500)

## 2017-11-07 NOTE — Progress Notes (Signed)
Here for follow up. Per pt " Im doing fine"  

## 2017-11-10 ENCOUNTER — Other Ambulatory Visit: Payer: Self-pay | Admitting: *Deleted

## 2017-11-12 ENCOUNTER — Other Ambulatory Visit: Payer: Self-pay | Admitting: *Deleted

## 2017-11-12 DIAGNOSIS — D0510 Intraductal carcinoma in situ of unspecified breast: Secondary | ICD-10-CM

## 2017-11-12 NOTE — Progress Notes (Signed)
im 

## 2017-11-24 ENCOUNTER — Ambulatory Visit (INDEPENDENT_AMBULATORY_CARE_PROVIDER_SITE_OTHER): Payer: Medicare HMO

## 2017-11-24 VITALS — BP 102/64 | HR 78 | Temp 99.0°F | Ht 64.0 in | Wt 140.4 lb

## 2017-11-24 DIAGNOSIS — Z0000 Encounter for general adult medical examination without abnormal findings: Secondary | ICD-10-CM | POA: Diagnosis not present

## 2017-11-24 DIAGNOSIS — Z9181 History of falling: Secondary | ICD-10-CM

## 2017-11-24 NOTE — Progress Notes (Addendum)
Subjective:   Cheyenne Cummings is a 72 y.o. female who presents for Medicare Annual (Subsequent) preventive examination.  Review of Systems:  N/A Cardiac Risk Factors include: advanced age (>39men, >48 women);dyslipidemia;hypertension;sedentary lifestyle;smoking/ tobacco exposure     Objective:     Vitals: BP 102/64 (BP Location: Right Arm, Patient Position: Sitting, Cuff Size: Normal)   Pulse 78   Temp 99 F (37.2 C) (Oral)   Ht 5\' 4"  (1.626 m)   Wt 140 lb 6.4 oz (63.7 kg)   SpO2 97%   BMI 24.10 kg/m   Body mass index is 24.1 kg/m.  Advanced Directives 11/24/2017 11/07/2017 10/02/2016 11/03/2015 09/18/2015 06/26/2015 01/06/2015  Does Patient Have a Medical Advance Directive? Yes Yes Yes No Yes Yes No  Type of Paramedic of [place];Living will Living will;Healthcare Power of Attorney Living will - Healthcare Power of [place] -  Does patient want to make changes to medical advance directive? - - - - - - -  Copy of [place] in Chart? Yes No - copy requested - - - - -  Would patient like information on creating a medical advance directive? - - - No - patient declined information - - No - patient declined information    Tobacco Social History   Tobacco Use  Smoking Status Current Every Day Smoker  . Packs/day: 0.50  . Years: 50.00  . Pack years: 25.00  . Types: Cigarettes  Smokeless Tobacco Never Used  Tobacco Comment   stopped then resumed 07/2017     Ready to quit: No Counseling given: Yes Comment: stopped then resumed 07/2017  Clinical Intake:  Pre-visit preparation completed: Yes  Pain : No/denies pain   BMI - recorded: 24.1 Nutritional Status: BMI of 19-24  Normal Nutritional Risks: None Diabetes: No  How often do you need to have someone help you when you read instructions, pamphlets, or other written materials from your doctor or pharmacy?: 1 - Never  Interpreter Needed?: No  Information  entered by :: AEversole, LPN  Past Medical History:  Diagnosis Date  . Anxiety disorder   . Breast cancer ([place]) 01/27/2013   right DCIS lumpectomy, radiation   . Edema extremities   . Herpes simplex   . Hyperlipidemia   . Hypertension   . Hypothyroidism   . Osteoarthritis   . Personal history of radiation therapy    Past Surgical History:  Procedure Laterality Date  . ARTHROSCOPY WITH ANTERIOR CRUCIATE LIGAMENT (ACL) REPAIR WITH ANTERIOR TIBILIAS GRAFT Bilateral 1996, 2003  . BREAST BIOPSY Right 10/27/15   Benign - fat necrosis/post rad changes  . BREAST LUMPECTOMY Right 2014   DCIS - XRT and Chemo  . BUNIONECTOMY Right 2015  . FOOT SURGERY Right 2018  . TONSILLECTOMY    . TOTAL HIP ARTHROPLASTY  2010  . TYMPANOPLASTY     Family History  Problem Relation Age of Onset  . Stroke Mother   . Hypertension Mother   . Kidney disease Mother   . Hypertension Father   . Aneurysm Father   . Cancer Maternal Grandmother   . Breast cancer Other 9       niece   Social History   Socioeconomic History  . Marital status: Married    Spouse name: Not on file  . Number of children: 2  . Years of education: Not on file  . Highest education level: Master's degree (e.g., MA, MS, MEng, MEd, MSW, MBA)  Occupational  History  . Occupation: Retired  Scientific laboratory technician  . Financial resource strain: Not hard at all  . Food insecurity:    Worry: Never true    Inability: Never true  . Transportation needs:    Medical: No    Non-medical: No  Tobacco Use  . Smoking status: Current Every Day Smoker    Packs/day: 0.50    Years: 50.00    Pack years: 25.00    Types: Cigarettes  . Smokeless tobacco: Never Used  . Tobacco comment: stopped then resumed 07/2017  Substance and Sexual Activity  . Alcohol use: Yes    Alcohol/week: 0.0 oz    Comment: occ  . Drug use: No  . Sexual activity: Not Currently  Lifestyle  . Physical activity:    Days per week: 0 days    Minutes per session: 0 min  .  Stress: Not at all  Relationships  . Social connections:    Talks on phone: Patient refused    Gets together: Patient refused    Attends religious service: Patient refused    Active member of club or organization: Patient refused    Attends meetings of clubs or organizations: Patient refused    Relationship status: Married  Other Topics Concern  . Not on file  Social History Narrative  . Not on file    Outpatient Encounter Medications as of 11/24/2017  Medication Sig  . acyclovir (ZOVIRAX) 200 MG capsule Take 1 capsule (200 mg total) by mouth 3 (three) times daily.  . cholecalciferol (VITAMIN D) 1000 UNITS tablet Take 1,000 Units by mouth daily.  . clonazePAM (KLONOPIN) 0.5 MG tablet Take 0.5 mg by mouth 2 (two) times daily.  . cyanocobalamin 1000 MCG tablet Take 1 tablet by mouth daily.  . fexofenadine (ALLEGRA) 180 MG tablet Take 1 tablet by mouth daily.  Marland Kitchen gabapentin (NEURONTIN) 300 MG capsule Take 300 mg by mouth daily.  Marland Kitchen levothyroxine (SYNTHROID, LEVOTHROID) 75 MCG tablet TAKE 1 TABLET DAILY  . losartan (COZAAR) 50 MG tablet Take 1 tablet (50 mg total) by mouth daily.  Marland Kitchen lovastatin (MEVACOR) 40 MG tablet Take 1 tablet (40 mg total) by mouth at bedtime.  . Multiple Vitamins-Minerals (MULTIVITAMIN WITH MINERALS) tablet Take 1 tablet by mouth daily.  . Naproxen Sodium 220 MG CAPS Take 1 capsule by mouth 2 (two) times daily as needed.  . tamoxifen (NOLVADEX) 20 MG tablet Take 1 tablet (20 mg total) by mouth daily.  Marland Kitchen venlafaxine XR (EFFEXOR-XR) 75 MG 24 hr capsule Take 1 capsule (75 mg total) by mouth daily.   No facility-administered encounter medications on file as of 11/24/2017.     Activities of Daily Living In your present state of health, do you have any difficulty performing the following activities: 11/24/2017  Hearing? N  Comment B hearing aids  Vision? N  Comment wears eyeglasses  Difficulty concentrating or making decisions? N  Walking or climbing stairs? N    Dressing or bathing? N  Doing errands, shopping? N  Preparing Food and eating ? N  Comment denies dentures  Using the Toilet? N  In the past six months, have you accidently leaked urine? Y  Comment stress incontinence  Do you have problems with loss of bowel control? N  Managing your Medications? N  Managing your Finances? N  Housekeeping or managing your Housekeeping? N  Some recent data might be hidden    Patient Care Team: Glean Hess, MD as PCP - General (Internal Medicine) Hobson-Webb, Lattie Haw  Donah Driver, MD as Referring Physician (Neurology) Waynetta Pean, MD as Consulting Physician (Surgery) Nila Nephew, MD as Consulting Physician (Orthopedic Surgery)    Assessment:   This is a routine wellness examination for [place].  Exercise Activities and Dietary recommendations Current Exercise Habits: The patient does not participate in regular exercise at present, Exercise limited by: None identified  Goals    . DIET - INCREASE WATER INTAKE     Recommend to drink at least 6-8 8oz glasses of water per day.       Fall Risk Fall Risk  11/24/2017 11/06/2017 10/02/2016 09/18/2015 01/06/2015  Falls in the past year? No No No No No  Risk for fall due to : Impaired vision;History of fall(s);Other (Comment) - - - -  Risk for fall due to: Comment wears eyeglasses; neuropathy; foot surgery - - - -   FALL RISK PREVENTION PERTAINING TO HOME: Is your home free of loose throw rugs in walkways, pet beds, electrical cords, etc? Yes Is there adequate lighting in your home to reduce risk of falls?  Yes Are there stairs in or around your home WITH handrails? Yes  ASSISTIVE DEVICES UTILIZED TO PREVENT FALLS: Use of a cane, walker or w/c? No Grab bars in the bathroom? Yes  Shower chair or a place to sit while bathing? Yes An elevated toilet seat or a handicapped toilet? No  Timed Get Up and Go Performed: Yes. Pt ambulated 10 feet within 8 sec. Gait stead-fast and without the use of an assistive  device. No intervention required at this time. Fall risk prevention has been discussed.  Community Resource Referral:  Data processing manager Referral sent to Care Guide for an elevated toilet seat.   Depression Screen PHQ 2/9 Scores 11/24/2017 11/06/2017 10/02/2016 09/18/2015  PHQ - 2 Score 0 0 0 0  PHQ- 9 Score 0 - - -     Cognitive Function     6CIT Screen 11/24/2017 10/02/2016  What Year? 0 points 0 points  What month? 0 points 0 points  What time? 0 points 0 points  Count back from 20 0 points 0 points  Months in reverse 0 points 0 points  Repeat phrase 0 points 0 points  Total Score 0 0    Immunization History  Administered Date(s) Administered  . Influenza,inj,Quad PF,6+ Mos 01/12/2015, 03/06/2016  . Influenza-Unspecified 02/17/2017  . Pneumococcal Conjugate-13 01/12/2015  . Pneumococcal Polysaccharide-23 04/30/2009, 10/02/2016  . Tdap 04/30/2009  . Zoster 04/30/2010    Qualifies for Shingles Vaccine? Yes. Zostavax completed 04/30/10. Due for Shingrix. Education has been provided regarding the importance of this vaccine. Pt has been advised to call insurance company to determine out of pocket expense. Advised may also receive vaccine at local pharmacy or Health Dept. Verbalized acceptance and understanding.  Screening Tests Health Maintenance  Topic Date Due  . INFLUENZA VACCINE  11/27/2017  . MAMMOGRAM  02/06/2018  . TETANUS/TDAP  05/01/2019  . COLONOSCOPY  06/28/2023  . DEXA SCAN  Completed  . PNA vac Low Risk Adult  Completed  . Hepatitis C Screening  Discontinued    Cancer Screenings: Lung: Low Dose CT Chest recommended if Age 83-80 years, 30 pack-year currently smoking OR have quit w/in 15years. Patient does qualify. Currently being followed by the cancer center. Breast:  Up to date on Mammogram? Yes. Completed 02/06/17. Repeat every year. Scheduled for completion 02/09/18   Up to date of Bone Density/Dexa? No. Completed 09/20/15. Results reflect osteopenia. Repeat  every 2 years. Ordered 11/06/17. Message  sent to referral coordinator for scheduling purposes. Pt aware she will receive a call from our office regarding her appt. Colorectal: Completed 06/27/13. Repeat every 10 years  Additional Screenings: Hepatitis C Screening: Declined    Plan:  I have personally reviewed and addressed the Medicare Annual Wellness questionnaire and have noted the following in the patient's chart:  A. Medical and social history B. Use of alcohol, tobacco or illicit drugs  C. Current medications and supplements D. Functional ability and status E.  Nutritional status F.  Physical activity G. Advance directives H. List of other physicians I.  Hospitalizations, surgeries, and ER visits in previous 12 months [facility] such as hearing and vision if needed, cognitive and depression L. Referrals and appointments  In addition, I have reviewed and discussed with patient certain preventive protocols, quality metrics, and best practice recommendations. A written personalized care plan for preventive services as well as general preventive health recommendations were provided to patient.  Signed,  Aleatha Borer, LPN Nurse Health Advisor  MD Recommendations: Zostavax completed 04/30/10. Due for Shingrix. Education has been provided regarding the importance of this vaccine. Pt has been advised to call insurance company to determine out of pocket expense. Advised may also receive vaccine at local pharmacy or Health Dept. Verbalized acceptance and understanding.  DEXA: Completed 09/20/15. Results reflect osteopenia. Repeat every 2 years. Ordered 11/06/17. Message sent to referral coordinator for scheduling purposes. Pt aware she will receive a call from our office regarding her appt.

## 2017-11-24 NOTE — Patient Instructions (Addendum)
Cheyenne Cummings , Thank you for taking time to come for your Medicare Wellness Visit. I appreciate your ongoing commitment to your health goals. Please review the following plan we discussed and let me know if I can assist you in the future.   Screening recommendations/referrals: Colorectal Screening: Up to date Mammogram: Please keep your appointment as scheduled on 02/09/18 Bone Density: You will receive a call from our office regarding your appointment  Vision and Dental Exams: Recommended annual ophthalmology exams for early detection of glaucoma and other disorders of the eye Recommended annual dental exams for proper oral hygiene  Vaccinations: Influenza vaccine: Up to date Pneumococcal vaccine: Up to date Tdap vaccine: Up to date Shingles vaccine: Please call your insurance company to determine your out of pocket expense for the Shingrix vaccine. You may also receive this vaccine at your local pharmacy or Health Dept.    Advanced directives: We have received a copy of your POA (Power of [place]) and/or Living Will. These documents can be located in your chart.  Goals: Recommend to drink at least 6-8 8oz glasses of water per day.  Next appointment: Please schedule your Annual Wellness Visit with your Nurse Health Advisor in one year.  Preventive Care 71 Years and Older, Female Preventive care refers to lifestyle choices and visits with your health care provider that can promote health and wellness. What does preventive care include?  A yearly physical exam. This is also called an annual well check.  Dental exams once or twice a year.  Routine eye exams. Ask your health care provider how often you should have your eyes checked.  Personal lifestyle choices, including:  Daily care of your teeth and gums.  Regular physical activity.  Eating a healthy diet.  Avoiding tobacco and drug use.  Limiting alcohol use.  Practicing safe sex.  Taking low-dose aspirin every  day.  Taking vitamin and mineral supplements as recommended by your health care provider. What happens during an annual well check? The services and screenings done by your health care provider during your annual well check will depend on your age, overall health, lifestyle risk factors, and family history of disease. Counseling  Your health care provider may ask you questions about your:  Alcohol use.  Tobacco use.  Drug use.  Emotional well-being.  Home and relationship well-being.  Sexual activity.  Eating habits.  History of falls.  Memory and ability to understand (cognition).  Work and work Statistician.  Reproductive health. Screening  You may have the following tests or measurements:  Height, weight, and BMI.  Blood pressure.  Lipid and cholesterol levels. These may be checked every 5 years, or more frequently if you are over 73 years old.  Skin check.  Lung cancer screening. You may have this screening every year starting at age 63 if you have a 30-pack-year history of smoking and currently smoke or have quit within the past 15 years.  Fecal occult blood test (FOBT) of the stool. You may have this test every year starting at age 15.  Flexible sigmoidoscopy or colonoscopy. You may have a sigmoidoscopy every 5 years or a colonoscopy every 10 years starting at age 27.  Hepatitis C blood test.  Hepatitis B blood test.  Sexually transmitted disease (STD) testing.  Diabetes screening. This is done by checking your blood sugar (glucose) after you have not eaten for a while (fasting). You may have this done every 1-3 years.  Bone density scan. This is done to screen for  osteoporosis. You may have this done starting at age 25.  Mammogram. This may be done every 1-2 years. Talk to your health care provider about how often you should have regular mammograms. Talk with your health care provider about your test results, treatment options, and if necessary, the need  for more tests. Vaccines  Your health care provider may recommend certain vaccines, such as:  Influenza vaccine. This is recommended every year.  Tetanus, diphtheria, and acellular pertussis (Tdap, Td) vaccine. You may need a Td booster every 10 years.  Zoster vaccine. You may need this after age 19.  Pneumococcal 13-valent conjugate (PCV13) vaccine. One dose is recommended after age 44.  Pneumococcal polysaccharide (PPSV23) vaccine. One dose is recommended after age 14. Talk to your health care provider about which screenings and vaccines you need and how often you need them. This information is not intended to replace advice given to you by your health care provider. Make sure you discuss any questions you have with your health care provider. Document Released: 05/12/2015 Document Revised: 01/03/2016 Document Reviewed: 02/14/2015 Elsevier Interactive Patient Education  2017 [place] Prevention in the Home Falls can cause injuries. They can happen to people of all ages. There are many things you can do to make your home safe and to help prevent falls. What can I do on the outside of my home?  Regularly fix the edges of walkways and driveways and fix any cracks.  Remove anything that might make you trip as you walk through a door, such as a raised step or threshold.  Trim any bushes or trees on the path to your home.  Use bright outdoor lighting.  Clear any walking paths of anything that might make someone trip, such as rocks or tools.  Regularly check to see if handrails are loose or broken. Make sure that both sides of any steps have handrails.  Any raised decks and porches should have guardrails on the edges.  Have any leaves, snow, or ice cleared regularly.  Use sand or salt on walking paths during winter.  Clean up any spills in your garage right away. This includes oil or grease spills. What can I do in the bathroom?  Use night lights.  Install grab bars  by the toilet and in the tub and shower. Do not use towel bars as grab bars.  Use non-skid mats or decals in the tub or shower.  If you need to sit down in the shower, use a plastic, non-slip stool.  Keep the floor dry. Clean up any water that spills on the floor as soon as it happens.  Remove soap buildup in the tub or shower regularly.  Attach bath mats securely with double-sided non-slip rug tape.  Do not have throw rugs and other things on the floor that can make you trip. What can I do in the bedroom?  Use night lights.  Make sure that you have a light by your bed that is easy to reach.  Do not use any sheets or blankets that are too big for your bed. They should not hang down onto the floor.  Have a firm chair that has side arms. You can use this for support while you get dressed.  Do not have throw rugs and other things on the floor that can make you trip. What can I do in the kitchen?  Clean up any spills right away.  Avoid walking on wet floors.  Keep items that you use  a lot in easy-to-reach places.  If you need to reach something above you, use a strong step stool that has a grab bar.  Keep electrical cords out of the way.  Do not use floor polish or wax that makes floors slippery. If you must use wax, use non-skid floor wax.  Do not have throw rugs and other things on the floor that can make you trip. What can I do with my stairs?  Do not leave any items on the stairs.  Make sure that there are handrails on both sides of the stairs and use them. Fix handrails that are broken or loose. Make sure that handrails are as long as the stairways.  Check any carpeting to make sure that it is firmly attached to the stairs. Fix any carpet that is loose or worn.  Avoid having throw rugs at the top or bottom of the stairs. If you do have throw rugs, attach them to the floor with carpet tape.  Make sure that you have a light switch at the top of the stairs and the  bottom of the stairs. If you do not have them, ask someone to add them for you. What else can I do to help prevent falls?  Wear shoes that:  Do not have high heels.  Have rubber bottoms.  Are comfortable and fit you well.  Are closed at the toe. Do not wear sandals.  If you use a stepladder:  Make sure that it is fully opened. Do not climb a closed stepladder.  Make sure that both sides of the stepladder are locked into place.  Ask someone to hold it for you, if possible.  Clearly mark and make sure that you can see:  Any grab bars or handrails.  First and last steps.  Where the edge of each step is.  Use tools that help you move around (mobility aids) if they are needed. These include:  Canes.  Walkers.  Scooters.  Crutches.  Turn on the lights when you go into a dark area. Replace any light bulbs as soon as they burn out.  Set up your furniture so you have a clear path. Avoid moving your furniture around.  If any of your floors are uneven, fix them.  If there are any pets around you, be aware of where they are.  Review your medicines with your doctor. Some medicines can make you feel dizzy. This can increase your chance of falling. Ask your doctor what other things that you can do to help prevent falls. This information is not intended to replace advice given to you by your health care provider. Make sure you discuss any questions you have with your health care provider. Document Released: 02/09/2009 Document Revised: 09/21/2015 Document Reviewed: 05/20/2014 Elsevier Interactive Patient Education  2017 [employer].

## 2017-11-25 DIAGNOSIS — M542 Cervicalgia: Secondary | ICD-10-CM | POA: Diagnosis not present

## 2017-11-26 DIAGNOSIS — G4733 Obstructive sleep apnea (adult) (pediatric): Secondary | ICD-10-CM | POA: Diagnosis not present

## 2017-12-16 DIAGNOSIS — M542 Cervicalgia: Secondary | ICD-10-CM | POA: Diagnosis not present

## 2017-12-17 DIAGNOSIS — M2041 Other hammer toe(s) (acquired), right foot: Secondary | ICD-10-CM | POA: Diagnosis not present

## 2017-12-17 DIAGNOSIS — M2011 Hallux valgus (acquired), right foot: Secondary | ICD-10-CM | POA: Diagnosis not present

## 2017-12-17 DIAGNOSIS — M79671 Pain in right foot: Secondary | ICD-10-CM | POA: Diagnosis not present

## 2018-01-15 DIAGNOSIS — G4733 Obstructive sleep apnea (adult) (pediatric): Secondary | ICD-10-CM | POA: Diagnosis not present

## 2018-01-23 DIAGNOSIS — G4733 Obstructive sleep apnea (adult) (pediatric): Secondary | ICD-10-CM | POA: Diagnosis not present

## 2018-01-26 ENCOUNTER — Other Ambulatory Visit: Payer: Self-pay | Admitting: Internal Medicine

## 2018-02-03 DIAGNOSIS — R69 Illness, unspecified: Secondary | ICD-10-CM | POA: Diagnosis not present

## 2018-02-09 ENCOUNTER — Ambulatory Visit
Admission: RE | Admit: 2018-02-09 | Discharge: 2018-02-09 | Disposition: A | Discharge status: Home / Self Care | Payer: Medicare HMO | Source: Ambulatory Visit | Attending: Oncology | Admitting: Oncology

## 2018-02-09 ENCOUNTER — Ambulatory Visit
Admission: RE | Admit: 2018-02-09 | Discharge: 2018-02-09 | Disposition: A | Discharge status: Home / Self Care | Payer: Medicare HMO | Source: Ambulatory Visit | Attending: Internal Medicine | Admitting: Internal Medicine

## 2018-02-09 DIAGNOSIS — M85851 Other specified disorders of bone density and structure, right thigh: Secondary | ICD-10-CM

## 2018-02-09 DIAGNOSIS — D0510 Intraductal carcinoma in situ of unspecified breast: Secondary | ICD-10-CM

## 2018-02-09 DIAGNOSIS — Z79810 Long term (current) use of selective estrogen receptor modulators (SERMs): Secondary | ICD-10-CM | POA: Diagnosis not present

## 2018-02-09 DIAGNOSIS — Z86000 Personal history of in-situ neoplasm of breast: Secondary | ICD-10-CM | POA: Insufficient documentation

## 2018-02-09 DIAGNOSIS — E2839 Other primary ovarian failure: Secondary | ICD-10-CM | POA: Diagnosis not present

## 2018-02-09 DIAGNOSIS — Z0389 Encounter for observation for other suspected diseases and conditions ruled out: Secondary | ICD-10-CM | POA: Insufficient documentation

## 2018-02-09 DIAGNOSIS — Z13820 Encounter for screening for osteoporosis: Secondary | ICD-10-CM | POA: Insufficient documentation

## 2018-02-09 DIAGNOSIS — Z96642 Presence of left artificial hip joint: Secondary | ICD-10-CM | POA: Insufficient documentation

## 2018-02-09 DIAGNOSIS — Z853 Personal history of malignant neoplasm of breast: Secondary | ICD-10-CM | POA: Diagnosis not present

## 2018-02-09 DIAGNOSIS — Z780 Asymptomatic menopausal state: Secondary | ICD-10-CM | POA: Diagnosis not present

## 2018-02-09 DIAGNOSIS — R928 Other abnormal and inconclusive findings on diagnostic imaging of breast: Secondary | ICD-10-CM | POA: Diagnosis not present

## 2018-02-16 DIAGNOSIS — H02839 Dermatochalasis of unspecified eye, unspecified eyelid: Secondary | ICD-10-CM | POA: Diagnosis not present

## 2018-02-16 DIAGNOSIS — H2513 Age-related nuclear cataract, bilateral: Secondary | ICD-10-CM | POA: Diagnosis not present

## 2018-02-16 DIAGNOSIS — H04123 Dry eye syndrome of bilateral lacrimal glands: Secondary | ICD-10-CM | POA: Diagnosis not present

## 2018-02-16 DIAGNOSIS — H02834 Dermatochalasis of left upper eyelid: Secondary | ICD-10-CM | POA: Diagnosis not present

## 2018-02-22 DIAGNOSIS — G4733 Obstructive sleep apnea (adult) (pediatric): Secondary | ICD-10-CM | POA: Diagnosis not present

## 2018-02-24 ENCOUNTER — Telehealth: Payer: Self-pay

## 2018-02-24 NOTE — Telephone Encounter (Signed)
Faxed optum forms

## 2018-03-23 DIAGNOSIS — G4733 Obstructive sleep apnea (adult) (pediatric): Secondary | ICD-10-CM | POA: Diagnosis not present

## 2018-03-25 DIAGNOSIS — G4733 Obstructive sleep apnea (adult) (pediatric): Secondary | ICD-10-CM | POA: Diagnosis not present

## 2018-04-24 DIAGNOSIS — G4733 Obstructive sleep apnea (adult) (pediatric): Secondary | ICD-10-CM | POA: Diagnosis not present

## 2018-04-27 DIAGNOSIS — G4733 Obstructive sleep apnea (adult) (pediatric): Secondary | ICD-10-CM | POA: Diagnosis not present

## 2018-05-11 ENCOUNTER — Encounter: Payer: Self-pay | Admitting: Internal Medicine

## 2018-05-11 ENCOUNTER — Other Ambulatory Visit
Admission: RE | Admit: 2018-05-11 | Discharge: 2018-05-11 | Disposition: A | Discharge status: Home / Self Care | Payer: Medicare HMO | Attending: Internal Medicine | Admitting: Internal Medicine

## 2018-05-11 ENCOUNTER — Telehealth: Payer: Self-pay

## 2018-05-11 ENCOUNTER — Ambulatory Visit (INDEPENDENT_AMBULATORY_CARE_PROVIDER_SITE_OTHER): Payer: Medicare HMO | Admitting: Internal Medicine

## 2018-05-11 ENCOUNTER — Other Ambulatory Visit: Payer: Self-pay | Admitting: *Deleted

## 2018-05-11 VITALS — BP 118/78 | HR 86 | Ht 64.0 in | Wt 142.6 lb

## 2018-05-11 DIAGNOSIS — E034 Atrophy of thyroid (acquired): Secondary | ICD-10-CM | POA: Diagnosis not present

## 2018-05-11 DIAGNOSIS — G4733 Obstructive sleep apnea (adult) (pediatric): Secondary | ICD-10-CM

## 2018-05-11 DIAGNOSIS — Z9989 Dependence on other enabling machines and devices: Secondary | ICD-10-CM | POA: Diagnosis not present

## 2018-05-11 DIAGNOSIS — I10 Essential (primary) hypertension: Secondary | ICD-10-CM | POA: Insufficient documentation

## 2018-05-11 DIAGNOSIS — G609 Hereditary and idiopathic neuropathy, unspecified: Secondary | ICD-10-CM | POA: Diagnosis not present

## 2018-05-11 LAB — BASIC METABOLIC PANEL
ANION GAP: 11 (ref 5–15)
BUN: 16 mg/dL (ref 8–23)
CALCIUM: 9.1 mg/dL (ref 8.9–10.3)
CHLORIDE: 96 mmol/L — AB (ref 98–111)
CO2: 26 mmol/L (ref 22–32)
Creatinine, Ser: 0.65 mg/dL (ref 0.44–1.00)
Glucose, Bld: 94 mg/dL (ref 70–99)
Potassium: 3.9 mmol/L (ref 3.5–5.1)
SODIUM: 133 mmol/L — AB (ref 135–145)

## 2018-05-11 LAB — TSH: TSH: 1.817 u[IU]/mL (ref 0.350–4.500)

## 2018-05-11 NOTE — Progress Notes (Signed)
Entered in error

## 2018-05-11 NOTE — Progress Notes (Signed)
Date:  05/11/2018   Name:  Cheyenne Cummings   DOB:  04-23-1946   MRN:  [id number]   Chief Complaint: Hypertension (6 month follow up.) and Hypothyroidism  Hypertension  This is a chronic problem. The problem is controlled. Associated symptoms include palpitations. Pertinent negatives include no chest pain, headaches or shortness of breath. Past treatments include angiotensin blockers. The current treatment provides significant improvement. Identifiable causes of hypertension include a thyroid problem.  Thyroid Problem  Presents for follow-up visit. Symptoms include palpitations. Patient reports no constipation, depressed mood, fatigue, leg swelling or weight loss. The symptoms have been stable.  Neuropathy - from XRT for breast cancer, stable sx.  Completed Tamoxifen.  Unsure if neuropathy is from XRT - more likely compression related since in the legs and feet.  Controlled by gabapentin and clonazepam. OSA - seen and evaluated at [facility], started on CPAP 01/2018 and doing well at Neurology follow up in November.  Epworth scale improved, sleeping average of 8 hours. Palpitations - she has noticed some mild palpitations intermittently, not associated with any other sx.  She is not physically active and has gained a few pounds.    Review of Systems  Constitutional: Negative for chills, fatigue, fever and weight loss.  Eyes: Negative for visual disturbance.  Respiratory: Negative for cough, choking, shortness of breath and wheezing.   Cardiovascular: Positive for palpitations. Negative for chest pain and leg swelling.  Gastrointestinal: Negative for constipation.  Neurological: Negative for dizziness, weakness and headaches.    Patient Active Problem List   Diagnosis Date Noted  . OSA on CPAP 05/11/2018  . Osteopenia of right hip 11/06/2017  . Hypothyroidism due to acquired atrophy of thyroid 10/02/2016  . Tinnitus of right ear 05/01/2016  . Ductal carcinoma in situ (DCIS) of right breast  09/18/2015  . Tobacco use disorder 09/18/2015  . Alphaherpesviral disease 10/01/2014  . Hyperlipidemia 10/01/2014  . Anxiety disorder due to known physiological condition 10/01/2014  . Personal history of tobacco use, presenting hazards to health 10/01/2014  . Environmental and seasonal allergies 10/01/2014  . BB block 10/01/2014  . Edema extremities 10/01/2014  . Essential (primary) hypertension 10/01/2014  . History of colon polyps 10/01/2014  . Family history of aneurysm 10/01/2014  . Arthritis of knee, degenerative 10/01/2014  . Addison anemia 10/01/2014  . Leg pain 02/11/2014  . Leg paresthesia 02/11/2014  . Plantar fasciitis 02/11/2014  . Neuropathy due to ionizing radiation ([place]) 02/11/2014    No Known Allergies  Past Surgical History:  Procedure Laterality Date  . ARTHROSCOPY WITH ANTERIOR CRUCIATE LIGAMENT (ACL) REPAIR WITH ANTERIOR TIBILIAS GRAFT Bilateral 1996, 2003  . BREAST BIOPSY Right 10/27/15   Benign - fat necrosis/post rad changes  . BREAST BIOPSY Right 02/25/2013   DCIS  . BREAST LUMPECTOMY Right 2014   DCIS - XRT , clear margins and negative LN  . BUNIONECTOMY Right 2015  . FOOT SURGERY Right 2018  . TONSILLECTOMY    . TOTAL HIP ARTHROPLASTY  2010  . TYMPANOPLASTY      Social History   Tobacco Use  . Smoking status: Former Smoker    Packs/day: 0.50    Years: 50.00    Pack years: 25.00    Types: Cigarettes    Last attempt to quit: 01/27/2017    Years since quitting: 1.2  . Smokeless tobacco: Never Used  . Tobacco comment: stopped then resumed 07/2017  Substance Use Topics  . Alcohol use: Yes    Alcohol/week: 0.0  standard drinks    Comment: occ  . Drug use: No     Medication list has been reviewed and updated.  Current Meds  Medication Sig  . acyclovir (ZOVIRAX) 200 MG capsule Take 1 capsule (200 mg total) by mouth 3 (three) times daily. (Patient taking differently: Take 200 mg by mouth 3 (three) times daily. PRN)  . calcium citrate  (CALCITRATE - DOSED IN MG ELEMENTAL CALCIUM) 950 MG tablet Take 200 mg of elemental calcium by mouth daily.  . cholecalciferol (VITAMIN D) 1000 UNITS tablet Take 1,000 Units by mouth daily.  . clonazePAM (KLONOPIN) 0.5 MG tablet Take 0.5 mg by mouth daily.   . cyanocobalamin 1000 MCG tablet Take 1 tablet by mouth daily.  . fexofenadine (ALLEGRA) 180 MG tablet Take 1 tablet by mouth daily. PRN  . gabapentin (NEURONTIN) 300 MG capsule Take 300 mg by mouth daily.  Marland Kitchen levothyroxine (SYNTHROID, LEVOTHROID) 75 MCG tablet TAKE 1 TABLET DAILY  . losartan (COZAAR) 50 MG tablet Take 1 tablet (50 mg total) by mouth daily.  Marland Kitchen lovastatin (MEVACOR) 40 MG tablet Take 1 tablet (40 mg total) by mouth at bedtime.  . Multiple Vitamins-Minerals (MULTIVITAMIN WITH MINERALS) tablet Take 1 tablet by mouth daily.  . Naproxen Sodium 220 MG CAPS Take 1 capsule by mouth 2 (two) times daily as needed.  . NON FORMULARY CPAP @@ 8 cm H2O  . Potassium 99 MG TABS Take 550 mg by mouth daily.  Marland Kitchen venlafaxine XR (EFFEXOR-XR) 75 MG 24 hr capsule Take 1 capsule by mouth one time daily    PHQ 2/9 Scores 05/11/2018 11/24/2017 11/06/2017 10/02/2016  PHQ - 2 Score 0 0 0 0  PHQ- 9 Score - 0 - -    Physical Exam Vitals signs and nursing note reviewed.  Constitutional:      General: She is not in acute distress.    Appearance: She is well-developed.  HENT:     Head: Normocephalic and atraumatic.  Eyes:     Pupils: Pupils are equal, round, and reactive to light.  Neck:     Musculoskeletal: Normal range of motion and neck supple.  Cardiovascular:     Rate and Rhythm: Normal rate and regular rhythm.     Pulses: Normal pulses.     Heart sounds: Normal heart sounds. No murmur.  Pulmonary:     Effort: Pulmonary effort is normal. No respiratory distress.     Breath sounds: Normal breath sounds. No wheezing or rales.  Musculoskeletal: Normal range of motion.  Skin:    General: Skin is warm and dry.     Findings: No rash.    Neurological:     Mental Status: She is alert and oriented to person, place, and time.  Psychiatric:        Behavior: Behavior normal.        Thought Content: Thought content normal.    Wt Readings from Last 3 Encounters:  05/11/18 142 lb 9.6 oz (64.7 kg)  11/24/17 140 lb 6.4 oz (63.7 kg)  11/07/17 140 lb 3.4 oz (63.6 kg)    BP 118/78 (BP Location: Right Arm, Patient Position: Sitting, Cuff Size: Normal)   Pulse 86   Ht 5\' 4"  (1.626 m)   Wt 142 lb 9.6 oz (64.7 kg)   SpO2 97%   BMI 24.48 kg/m   Assessment and Plan: 1. Essential (primary) hypertension Controlled Palpitations are benign in character - recommend starting an exercise routine and if sx worsen, would refer to cardiology -  Basic metabolic panel  2. Hypothyroidism due to acquired atrophy of thyroid supplemented - TSH  3. Idiopathic neuropathy Stable on current medications  4. OSA on CPAP Doing well on nightly CPAP, followed by neurology   Partially dictated using Dragon software. Any errors are unintentional.  Halina Maidens, MD [facility]  05/11/2018

## 2018-05-11 NOTE — Telephone Encounter (Signed)
Let he know that I can order the mammogram at her July visit with me.

## 2018-05-11 NOTE — Telephone Encounter (Signed)
Replied to patient mychart message informing this.

## 2018-05-11 NOTE — Telephone Encounter (Signed)
Accidentally "Completed" a my chart message from patient. Please review my chart message below...  "I canceled my appointment with Dr. Grayland Ormond( oncologist) after seeing you. They told me I would need you to schedule appointment for regular screening mammogram ( not the in depth one I used to have) and could have it done here in [place]. I think she said the last one was on October 19th, 2019 , so sometime around that for this year. Thank you so much. Cheyenne Cummings . If I can call and do it myself- let me know- just need the phone number"  Needs new screening mammo ordered?

## 2018-05-25 DIAGNOSIS — G4733 Obstructive sleep apnea (adult) (pediatric): Secondary | ICD-10-CM | POA: Diagnosis not present

## 2018-06-10 ENCOUNTER — Other Ambulatory Visit: Payer: Self-pay | Admitting: Internal Medicine

## 2018-06-11 DIAGNOSIS — G629 Polyneuropathy, unspecified: Secondary | ICD-10-CM | POA: Diagnosis not present

## 2018-06-25 DIAGNOSIS — G4733 Obstructive sleep apnea (adult) (pediatric): Secondary | ICD-10-CM | POA: Diagnosis not present

## 2018-07-24 DIAGNOSIS — G4733 Obstructive sleep apnea (adult) (pediatric): Secondary | ICD-10-CM | POA: Diagnosis not present

## 2018-07-27 DIAGNOSIS — G4733 Obstructive sleep apnea (adult) (pediatric): Secondary | ICD-10-CM | POA: Diagnosis not present

## 2018-08-24 DIAGNOSIS — G4733 Obstructive sleep apnea (adult) (pediatric): Secondary | ICD-10-CM | POA: Diagnosis not present

## 2018-08-26 DIAGNOSIS — L818 Other specified disorders of pigmentation: Secondary | ICD-10-CM | POA: Diagnosis not present

## 2018-08-26 DIAGNOSIS — Z86018 Personal history of other benign neoplasm: Secondary | ICD-10-CM | POA: Diagnosis not present

## 2018-08-26 DIAGNOSIS — L578 Other skin changes due to chronic exposure to nonionizing radiation: Secondary | ICD-10-CM | POA: Diagnosis not present

## 2018-08-26 DIAGNOSIS — M720 Palmar fascial fibromatosis [Dupuytren]: Secondary | ICD-10-CM | POA: Diagnosis not present

## 2018-08-26 DIAGNOSIS — L608 Other nail disorders: Secondary | ICD-10-CM | POA: Diagnosis not present

## 2018-08-26 DIAGNOSIS — D1800 Hemangioma unspecified site: Secondary | ICD-10-CM | POA: Diagnosis not present

## 2018-08-26 DIAGNOSIS — L570 Actinic keratosis: Secondary | ICD-10-CM | POA: Diagnosis not present

## 2018-08-26 DIAGNOSIS — B351 Tinea unguium: Secondary | ICD-10-CM | POA: Diagnosis not present

## 2018-08-26 DIAGNOSIS — L249 Irritant contact dermatitis, unspecified cause: Secondary | ICD-10-CM | POA: Diagnosis not present

## 2018-08-26 DIAGNOSIS — D485 Neoplasm of uncertain behavior of skin: Secondary | ICD-10-CM | POA: Diagnosis not present

## 2018-08-26 DIAGNOSIS — Z85820 Personal history of malignant melanoma of skin: Secondary | ICD-10-CM | POA: Diagnosis not present

## 2018-08-28 ENCOUNTER — Other Ambulatory Visit: Payer: Self-pay | Admitting: Internal Medicine

## 2018-09-08 DIAGNOSIS — M172 Bilateral post-traumatic osteoarthritis of knee: Secondary | ICD-10-CM | POA: Insufficient documentation

## 2018-09-08 DIAGNOSIS — M25461 Effusion, right knee: Secondary | ICD-10-CM | POA: Diagnosis not present

## 2018-09-08 DIAGNOSIS — Z96642 Presence of left artificial hip joint: Secondary | ICD-10-CM | POA: Insufficient documentation

## 2018-09-08 DIAGNOSIS — M25552 Pain in left hip: Secondary | ICD-10-CM | POA: Diagnosis not present

## 2018-09-08 DIAGNOSIS — M47816 Spondylosis without myelopathy or radiculopathy, lumbar region: Secondary | ICD-10-CM | POA: Diagnosis not present

## 2018-09-08 DIAGNOSIS — M25561 Pain in right knee: Secondary | ICD-10-CM | POA: Diagnosis not present

## 2018-09-08 DIAGNOSIS — G8929 Other chronic pain: Secondary | ICD-10-CM | POA: Insufficient documentation

## 2018-09-08 DIAGNOSIS — M1711 Unilateral primary osteoarthritis, right knee: Secondary | ICD-10-CM | POA: Diagnosis not present

## 2018-09-10 DIAGNOSIS — Z872 Personal history of diseases of the skin and subcutaneous tissue: Secondary | ICD-10-CM | POA: Diagnosis not present

## 2018-09-10 DIAGNOSIS — L0390 Cellulitis, unspecified: Secondary | ICD-10-CM | POA: Diagnosis not present

## 2018-09-16 DIAGNOSIS — Z96642 Presence of left artificial hip joint: Secondary | ICD-10-CM | POA: Diagnosis not present

## 2018-09-16 DIAGNOSIS — M25552 Pain in left hip: Secondary | ICD-10-CM | POA: Diagnosis not present

## 2018-09-16 DIAGNOSIS — G8929 Other chronic pain: Secondary | ICD-10-CM | POA: Diagnosis not present

## 2018-09-18 ENCOUNTER — Encounter: Payer: Self-pay | Admitting: Internal Medicine

## 2018-09-18 ENCOUNTER — Telehealth: Payer: Self-pay

## 2018-09-18 NOTE — Telephone Encounter (Signed)
Patient was advised we will only call her if PCP decides there is reason to get involved. She will stick to [facility] otherwise and does know if no one calls Tue we will expect [facility] to remain in control over this. I tried to call and left message to continue with [facility].

## 2018-09-18 NOTE — Telephone Encounter (Signed)
The person who ordered the labs needs to address all her questions and give her advice on what the next step is.  I assume they are thinking that her old hip replacement is leaching metals into her system.  And I assume the only treatment is a joint replacement revision but that is not my specialty.

## 2018-09-18 NOTE — Telephone Encounter (Signed)
Went for Hip Revision consult and PA at [facility] ordered labs that show high levels of Chromium and Cobalt. Patient used google and now feels her symptoms over a bit could be related to this Medal. Cheyenne Cummings her to call the person who ordered these labs and she mentioned appt in November but wants to be sure there is no danger in waiting this long Advised her that I will ask PCP to view them but that we will not be handling this and she needs to call PA that already knows and those who are doing the surgery and MRI. She is aware if we do not get to look at the labs or can not see an urgent matter in it that we will not call her Tuesday and either way PA that ordered needs to handle what to do next and she will see them in Nov and call to discuss concerns.

## 2018-09-23 DIAGNOSIS — G4733 Obstructive sleep apnea (adult) (pediatric): Secondary | ICD-10-CM | POA: Diagnosis not present

## 2018-09-23 DIAGNOSIS — D485 Neoplasm of uncertain behavior of skin: Secondary | ICD-10-CM | POA: Diagnosis not present

## 2018-09-23 DIAGNOSIS — D2262 Melanocytic nevi of left upper limb, including shoulder: Secondary | ICD-10-CM | POA: Diagnosis not present

## 2018-10-24 DIAGNOSIS — G4733 Obstructive sleep apnea (adult) (pediatric): Secondary | ICD-10-CM | POA: Diagnosis not present

## 2018-10-27 DIAGNOSIS — G4733 Obstructive sleep apnea (adult) (pediatric): Secondary | ICD-10-CM | POA: Diagnosis not present

## 2018-11-03 DIAGNOSIS — R69 Illness, unspecified: Secondary | ICD-10-CM | POA: Diagnosis not present

## 2018-11-06 ENCOUNTER — Encounter: Payer: Self-pay | Admitting: Oncology

## 2018-11-06 ENCOUNTER — Telehealth: Payer: Self-pay | Admitting: *Deleted

## 2018-11-06 NOTE — Telephone Encounter (Signed)
Patient has been notified that lung cancer screening CT scan is due currently or will be in near future. Confirmed that patient is within the appropriate age range, and asymptomatic, (no signs or symptoms of lung cancer). Patient denies illness that would prevent curative treatment for lung cancer if found. Verified smoking history (current smoker, 1 pack/ daily). Patient is agreeable for CT scan being scheduled and prefers am appointments.

## 2018-11-09 ENCOUNTER — Encounter: Payer: Self-pay | Admitting: Internal Medicine

## 2018-11-09 ENCOUNTER — Other Ambulatory Visit: Payer: Self-pay | Admitting: *Deleted

## 2018-11-09 DIAGNOSIS — I77810 Thoracic aortic ectasia: Secondary | ICD-10-CM

## 2018-11-09 DIAGNOSIS — Z122 Encounter for screening for malignant neoplasm of respiratory organs: Secondary | ICD-10-CM

## 2018-11-09 DIAGNOSIS — Z87891 Personal history of nicotine dependence: Secondary | ICD-10-CM

## 2018-11-09 HISTORY — DX: Thoracic aortic ectasia: I77.810

## 2018-11-11 ENCOUNTER — Other Ambulatory Visit: Payer: Self-pay

## 2018-11-11 ENCOUNTER — Encounter: Payer: Self-pay | Admitting: Internal Medicine

## 2018-11-11 ENCOUNTER — Ambulatory Visit (INDEPENDENT_AMBULATORY_CARE_PROVIDER_SITE_OTHER): Payer: Medicare HMO | Admitting: Internal Medicine

## 2018-11-11 VITALS — BP 102/68 | HR 78 | Ht 64.0 in | Wt 142.0 lb

## 2018-11-11 DIAGNOSIS — Z1231 Encounter for screening mammogram for malignant neoplasm of breast: Secondary | ICD-10-CM

## 2018-11-11 DIAGNOSIS — Z0000 Encounter for general adult medical examination without abnormal findings: Secondary | ICD-10-CM | POA: Diagnosis not present

## 2018-11-11 DIAGNOSIS — Z86010 Personal history of colonic polyps: Secondary | ICD-10-CM | POA: Diagnosis not present

## 2018-11-11 DIAGNOSIS — D0511 Intraductal carcinoma in situ of right breast: Secondary | ICD-10-CM | POA: Diagnosis not present

## 2018-11-11 LAB — POCT URINALYSIS DIPSTICK
Bilirubin, UA: NEGATIVE
Blood, UA: NEGATIVE
Glucose, UA: NEGATIVE
Ketones, UA: NEGATIVE
Leukocytes, UA: NEGATIVE
Protein, UA: NEGATIVE
Spec Grav, UA: 1.01 (ref 1.010–1.025)
Urobilinogen, UA: 0.2 E.U./dL
pH, UA: 7 (ref 5.0–8.0)

## 2018-11-11 NOTE — Progress Notes (Signed)
Date:  11/11/2018   Name:  Cheyenne Cummings   DOB:  [birth year]   MRN:  [id number]   Chief Complaint: Annual Exam (Breast Exam. ) Cheyenne Cummings is a 73 y.o. female who presents today for her Complete Annual Exam. She feels well. She reports exercising gardening. She reports she is sleeping fairly well. No breast issues.  Mammogram 02/2018 Colonoscopy 2015 Immunizations up to date  Tobacco use - pt states that she has quit smoking completely as of last month.  She feels well and believes that she can remain tobacco free.  She has a LDCT scheduled for next week.  HPI  Pt declines to discuss health issues outside of her wellness visit. She will return in the near future for evaluation of other issues.  Lab Results  Component Value Date   CREATININE 0.65 05/11/2018   BUN 16 05/11/2018   NA 133 (L) 05/11/2018   K 3.9 05/11/2018   CL 96 (L) 05/11/2018   CO2 26 05/11/2018   Lab Results  Component Value Date   CHOL 158 11/06/2017   HDL 71 11/06/2017   LDLCALC 59 11/06/2017   TRIG 138 11/06/2017   CHOLHDL 2.2 11/06/2017   Lab Results  Component Value Date   TSH 1.817 05/11/2018     Review of Systems  Constitutional: Negative for chills and fever.  HENT: Positive for hearing loss. Negative for congestion, tinnitus, trouble swallowing and voice change.   Eyes: Negative for visual disturbance.  Respiratory: Negative for cough, chest tightness and wheezing.   Cardiovascular: Negative for leg swelling.  Gastrointestinal: Negative for abdominal pain and vomiting.  Endocrine: Negative for polydipsia and polyuria.  Genitourinary: Negative for dysuria, frequency, genital sores, vaginal bleeding and vaginal discharge.  Musculoskeletal: Negative for arthralgias, gait problem and joint swelling.  Skin: Negative for color change and rash.  Allergic/Immunologic: Negative for environmental allergies.  Neurological: Negative for light-headedness.  Hematological: Negative for adenopathy.  Does not bruise/bleed easily.  Psychiatric/Behavioral: Negative for dysphoric mood and sleep disturbance.    Patient Active Problem List   Diagnosis Date Noted  . Ectatic thoracic aorta ([place]) 11/09/2018  . OSA on CPAP 05/11/2018  . Osteopenia of right hip 11/06/2017  . Hypothyroidism due to acquired atrophy of thyroid 10/02/2016  . Tinnitus of right ear 05/01/2016  . Ductal carcinoma in situ (DCIS) of right breast 09/18/2015  . Tobacco use disorder, moderate, in sustained remission, dependence 09/18/2015  . Alphaherpesviral disease 10/01/2014  . Hyperlipidemia 10/01/2014  . Anxiety disorder due to known physiological condition 10/01/2014  . Environmental and seasonal allergies 10/01/2014  . BB block 10/01/2014  . Edema extremities 10/01/2014  . Essential (primary) hypertension 10/01/2014  . History of colon polyps 10/01/2014  . Arthritis of knee, degenerative 10/01/2014  . Addison anemia 10/01/2014  . Plantar fasciitis 02/11/2014  . Radiation-induced polyneuropathy ([place]) 02/11/2014    No Known Allergies  Past Surgical History:  Procedure Laterality Date  . ARTHROSCOPY WITH ANTERIOR CRUCIATE LIGAMENT (ACL) REPAIR WITH ANTERIOR TIBILIAS GRAFT Bilateral 1996, 2003  . BREAST BIOPSY Right 10/27/15   Benign - fat necrosis/post rad changes  . BREAST BIOPSY Right 02/25/2013   DCIS  . BREAST LUMPECTOMY Right 2014   DCIS - XRT , clear margins and negative LN  . BUNIONECTOMY Right 2015  . FOOT SURGERY Right 2018  . TONSILLECTOMY    . TOTAL HIP ARTHROPLASTY  2010  . TYMPANOPLASTY      Social History   Tobacco Use  .  Smoking status: Former Smoker    Packs/day: 0.50    Years: 50.00    Pack years: 25.00    Types: Cigarettes    Quit date: 10/12/2018    Years since quitting: 0.0  . Smokeless tobacco: Never Used  Substance Use Topics  . Alcohol use: Yes    Alcohol/week: 0.0 standard drinks    Comment: occ  . Drug use: No     Medication list has been reviewed and updated.   Current Meds  Medication Sig  . cholecalciferol (VITAMIN D) 1000 UNITS tablet Take 1,000 Units by mouth daily.  . clonazePAM (KLONOPIN) 0.5 MG tablet Take 0.5 mg by mouth daily.   . fexofenadine (ALLEGRA) 180 MG tablet Take 1 tablet by mouth daily. PRN  . gabapentin (NEURONTIN) 300 MG capsule Take 300 mg by mouth daily.  Marland Kitchen levothyroxine (SYNTHROID, LEVOTHROID) 75 MCG tablet TAKE 1 TABLET DAILY  . losartan (COZAAR) 50 MG tablet TAKE 1 TABLET DAILY  . lovastatin (MEVACOR) 40 MG tablet TAKE 1 TABLET AT BEDTIME  . meloxicam (MOBIC) 15 MG tablet Take 15 mg by mouth daily.  . Multiple Vitamins-Minerals (MULTIVITAMIN WITH MINERALS) tablet Take 1 tablet by mouth daily.  . NON FORMULARY CPAP @@ 8 cm H2O  . venlafaxine XR (EFFEXOR-XR) 75 MG 24 hr capsule Take 1 capsule by mouth one time daily    PHQ 2/9 Scores 11/11/2018 05/11/2018 11/24/2017 11/06/2017  PHQ - 2 Score 0 0 0 0  PHQ- 9 Score 0 - 0 -    BP Readings from Last 3 Encounters:  11/11/18 102/68  05/11/18 118/78  11/24/17 102/64    Physical Exam Vitals signs and nursing note reviewed.  Constitutional:      General: She is not in acute distress.    Appearance: She is well-developed.  HENT:     Head: Normocephalic and atraumatic.     Right Ear: Tympanic membrane and ear canal normal.     Left Ear: Tympanic membrane and ear canal normal.     Nose:     Right Sinus: No maxillary sinus tenderness.     Left Sinus: No maxillary sinus tenderness.  Eyes:     General: No scleral icterus.       Right eye: No discharge.        Left eye: No discharge.     Conjunctiva/sclera: Conjunctivae normal.  Neck:     Musculoskeletal: Normal range of motion. No erythema.     Thyroid: No thyromegaly.     Vascular: No carotid bruit.  Cardiovascular:     Rate and Rhythm: Normal rate and regular rhythm.     Pulses: Normal pulses.     Heart sounds: Normal heart sounds.  Pulmonary:     Effort: Pulmonary effort is normal. No respiratory distress.      Breath sounds: No wheezing.  Chest:     Breasts:        Right: No mass, nipple discharge, skin change (surgical scar) or tenderness.        Left: No mass, nipple discharge, skin change or tenderness.  Abdominal:     General: Bowel sounds are normal.     Palpations: Abdomen is soft.     Tenderness: There is no abdominal tenderness.  Musculoskeletal: Normal range of motion.     Right lower leg: No edema.     Left lower leg: No edema.  Lymphadenopathy:     Cervical: No cervical adenopathy.  Skin:    General: Skin is warm  and dry.     Capillary Refill: Capillary refill takes less than 2 seconds.     Findings: No rash.  Neurological:     Mental Status: She is alert and oriented to person, place, and time.     Cranial Nerves: No cranial nerve deficit.     Sensory: No sensory deficit.     Deep Tendon Reflexes: Reflexes are normal and symmetric.     Reflex Scores:      Bicep reflexes are 2+ on the right side and 2+ on the left side.      Patellar reflexes are 2+ on the right side and 2+ on the left side. Psychiatric:        Attention and Perception: Attention normal.        Mood and Affect: Mood normal.        Speech: Speech normal.        Behavior: Behavior normal.        Thought Content: Thought content normal.     Wt Readings from Last 3 Encounters:  11/11/18 142 lb (64.4 kg)  05/11/18 142 lb 9.6 oz (64.7 kg)  11/24/17 140 lb 6.4 oz (63.7 kg)    BP 102/68   Pulse 78   Ht 5\' 4"  (1.626 m)   Wt 142 lb (64.4 kg)   SpO2 97%   BMI 24.37 kg/m   Assessment and Plan: 1. Annual physical exam Normal exam Continue current medications - CBC with Differential/Platelet - Comprehensive metabolic panel - Lipid panel - TSH + free T4 - POCT urinalysis dipstick  2. Encounter for screening mammogram for breast cancer Will refer for DD mammogram  3. Ductal carcinoma in situ (DCIS) of right breast Pt released from Oncology follow up - MM DIAG BREAST TOMO BILATERAL; Future  4. Hx  of adenomatous colonic polyps Due for follow up colonoscopy with Dr. Comer Locket - Ambulatory referral to Gastroenterology   Partially dictated using Dragon software. Any errors are unintentional.  Halina Maidens, MD [facility]  11/11/2018

## 2018-11-12 LAB — COMPREHENSIVE METABOLIC PANEL
ALT: 20 IU/L (ref 0–32)
AST: 25 IU/L (ref 0–40)
Albumin/Globulin Ratio: 2.1 (ref 1.2–2.2)
Albumin: 4.7 g/dL (ref 3.7–4.7)
Alkaline Phosphatase: 70 IU/L (ref 39–117)
BUN/Creatinine Ratio: 22 (ref 12–28)
BUN: 18 mg/dL (ref 8–27)
Bilirubin Total: 0.4 mg/dL (ref 0.0–1.2)
CO2: 25 mmol/L (ref 20–29)
Calcium: 9.6 mg/dL (ref 8.7–10.3)
Chloride: 98 mmol/L (ref 96–106)
Creatinine, Ser: 0.81 mg/dL (ref 0.57–1.00)
GFR calc Af Amer: 84 mL/min/{1.73_m2} (ref >59)
GFR calc non Af Amer: 73 mL/min/{1.73_m2} (ref >59)
Globulin, Total: 2.2 g/dL (ref 1.5–4.5)
Glucose: 85 mg/dL (ref 65–99)
Potassium: 4.8 mmol/L (ref 3.5–5.2)
Sodium: 137 mmol/L (ref 134–144)
Total Protein: 6.9 g/dL (ref 6.0–8.5)

## 2018-11-12 LAB — CBC WITH DIFFERENTIAL/PLATELET
Basophils Absolute: 0 10*3/uL (ref 0.0–0.2)
Basos: 1 %
EOS (ABSOLUTE): 0.2 10*3/uL (ref 0.0–0.4)
Eos: 4 %
Hematocrit: 40.8 % (ref 34.0–46.6)
Hemoglobin: 13.7 g/dL (ref 11.1–15.9)
Immature Grans (Abs): 0 10*3/uL (ref 0.0–0.1)
Immature Granulocytes: 0 %
Lymphocytes Absolute: 0.9 10*3/uL (ref 0.7–3.1)
Lymphs: 25 %
MCH: 31.3 pg (ref 26.6–33.0)
MCHC: 33.6 g/dL (ref 31.5–35.7)
MCV: 93 fL (ref 79–97)
Monocytes Absolute: 0.4 10*3/uL (ref 0.1–0.9)
Monocytes: 11 %
Neutrophils Absolute: 2.1 10*3/uL (ref 1.4–7.0)
Neutrophils: 59 %
Platelets: 225 10*3/uL (ref 150–450)
RBC: 4.38 x10E6/uL (ref 3.77–5.28)
RDW: 13.1 % (ref 11.7–15.4)
WBC: 3.5 10*3/uL (ref 3.4–10.8)

## 2018-11-12 LAB — TSH+FREE T4
Free T4: 1.11 ng/dL (ref 0.82–1.77)
TSH: 0.915 u[IU]/mL (ref 0.450–4.500)

## 2018-11-12 LAB — LIPID PANEL
Chol/HDL Ratio: 2.1 ratio (ref 0.0–4.4)
Cholesterol, Total: 156 mg/dL (ref 100–199)
HDL: 75 mg/dL (ref >39)
LDL Calculated: 67 mg/dL (ref 0–99)
Triglycerides: 69 mg/dL (ref 0–149)
VLDL Cholesterol Cal: 14 mg/dL (ref 5–40)

## 2018-11-13 ENCOUNTER — Other Ambulatory Visit: Payer: Self-pay

## 2018-11-13 ENCOUNTER — Ambulatory Visit: Payer: Medicare HMO | Admitting: Oncology

## 2018-11-13 ENCOUNTER — Ambulatory Visit
Admission: RE | Admit: 2018-11-13 | Discharge: 2018-11-13 | Disposition: A | Discharge status: Home / Self Care | Payer: Medicare HMO | Source: Ambulatory Visit | Attending: Oncology | Admitting: Oncology

## 2018-11-13 DIAGNOSIS — Z122 Encounter for screening for malignant neoplasm of respiratory organs: Secondary | ICD-10-CM | POA: Insufficient documentation

## 2018-11-13 DIAGNOSIS — Z87891 Personal history of nicotine dependence: Secondary | ICD-10-CM

## 2018-11-17 ENCOUNTER — Encounter: Payer: Self-pay | Admitting: *Deleted

## 2018-11-30 ENCOUNTER — Ambulatory Visit (INDEPENDENT_AMBULATORY_CARE_PROVIDER_SITE_OTHER): Payer: Medicare HMO

## 2018-11-30 VITALS — BP 136/82 | HR 72 | Ht 64.0 in | Wt 142.0 lb

## 2018-11-30 DIAGNOSIS — Z0000 Encounter for general adult medical examination without abnormal findings: Secondary | ICD-10-CM | POA: Diagnosis not present

## 2018-11-30 NOTE — Patient Instructions (Addendum)
[name] , Thank you for taking time to come for your Medicare Wellness Visit. I appreciate your ongoing commitment to your health goals. Please review the following plan we discussed and let me know if I can assist you in the future.   Screening recommendations/referrals: Colonoscopy: done 07/20/13. Scheduled for 01/2019 Mammogram: done 02/09/18. Please call [phone] to schedule your mammogram.  Bone Density: done 02/09/18 Recommended yearly ophthalmology/optometry visit for glaucoma screening and checkup Recommended yearly dental visit for hygiene and checkup  Vaccinations: Influenza vaccine: done 02/03/18 Pneumococcal vaccine: done 10/02/16 Tdap vaccine: done 2011 Shingles vaccine: done 04/26/18    Conditions/risks identified: Recommend increasing physical activity to at least 3 days per week.  Next appointment: Please follow up in one year for your Medicare Annual Wellness visit.     Preventive Care 72 Years and Older, Female Preventive care refers to lifestyle choices and visits with your health care provider that can promote health and wellness. What does preventive care include?  A yearly physical exam. This is also called an annual well check.  Dental exams once or twice a year.  Routine eye exams. Ask your health care provider how often you should have your eyes checked.  Personal lifestyle choices, including:  Daily care of your teeth and gums.  Regular physical activity.  Eating a healthy diet.  Avoiding tobacco and drug use.  Limiting alcohol use.  Practicing safe sex.  Taking low-dose aspirin every day.  Taking vitamin and mineral supplements as recommended by your health care provider. What happens during an annual well check? The services and screenings done by your health care provider during your annual well check will depend on your age, overall health, lifestyle risk factors, and family history of disease. Counseling  Your health care provider  may ask you questions about your:  Alcohol use.  Tobacco use.  Drug use.  Emotional well-being.  Home and relationship well-being.  Sexual activity.  Eating habits.  History of falls.  Memory and ability to understand (cognition).  Work and work Statistician.  Reproductive health. Screening  You may have the following tests or measurements:  Height, weight, and BMI.  Blood pressure.  Lipid and cholesterol levels. These may be checked every 5 years, or more frequently if you are over 88 years old.  Skin check.  Lung cancer screening. You may have this screening every year starting at age 35 if you have a 30-pack-year history of smoking and currently smoke or have quit within the past 15 years.  Fecal occult blood test (FOBT) of the stool. You may have this test every year starting at age 18.  Flexible sigmoidoscopy or colonoscopy. You may have a sigmoidoscopy every 5 years or a colonoscopy every 10 years starting at age 69.  Hepatitis C blood test.  Hepatitis B blood test.  Sexually transmitted disease (STD) testing.  Diabetes screening. This is done by checking your blood sugar (glucose) after you have not eaten for a while (fasting). You may have this done every 1-3 years.  Bone density scan. This is done to screen for osteoporosis. You may have this done starting at age 41.  Mammogram. This may be done every 1-2 years. Talk to your health care provider about how often you should have regular mammograms. Talk with your health care provider about your test results, treatment options, and if necessary, the need for more tests. Vaccines  Your health care provider may recommend certain vaccines, such as:  Influenza vaccine. This is recommended  every year.  Tetanus, diphtheria, and acellular pertussis (Tdap, Td) vaccine. You may need a Td booster every 10 years.  Zoster vaccine. You may need this after age 43.  Pneumococcal 13-valent conjugate (PCV13) vaccine.  One dose is recommended after age 83.  Pneumococcal polysaccharide (PPSV23) vaccine. One dose is recommended after age 39. Talk to your health care provider about which screenings and vaccines you need and how often you need them. This information is not intended to replace advice given to you by your health care provider. Make sure you discuss any questions you have with your health care provider. Document Released: 05/12/2015 Document Revised: 01/03/2016 Document Reviewed: 02/14/2015 Elsevier Interactive Patient Education  2017 [employer] Prevention in the Home Falls can cause injuries. They can happen to people of all ages. There are many things you can do to make your home safe and to help prevent falls. What can I do on the outside of my home?  Regularly fix the edges of walkways and driveways and fix any cracks.  Remove anything that might make you trip as you walk through a door, such as a raised step or threshold.  Trim any bushes or trees on the path to your home.  Use bright outdoor lighting.  Clear any walking paths of anything that might make someone trip, such as rocks or tools.  Regularly check to see if handrails are loose or broken. Make sure that both sides of any steps have handrails.  Any raised decks and porches should have guardrails on the edges.  Have any leaves, snow, or ice cleared regularly.  Use sand or salt on walking paths during winter.  Clean up any spills in your garage right away. This includes oil or grease spills. What can I do in the bathroom?  Use night lights.  Install grab bars by the toilet and in the tub and shower. Do not use towel bars as grab bars.  Use non-skid mats or decals in the tub or shower.  If you need to sit down in the shower, use a plastic, non-slip stool.  Keep the floor dry. Clean up any water that spills on the floor as soon as it happens.  Remove soap buildup in the tub or shower regularly.  Attach bath  mats securely with double-sided non-slip rug tape.  Do not have throw rugs and other things on the floor that can make you trip. What can I do in the bedroom?  Use night lights.  Make sure that you have a light by your bed that is easy to reach.  Do not use any sheets or blankets that are too big for your bed. They should not hang down onto the floor.  Have a firm chair that has side arms. You can use this for support while you get dressed.  Do not have throw rugs and other things on the floor that can make you trip. What can I do in the kitchen?  Clean up any spills right away.  Avoid walking on wet floors.  Keep items that you use a lot in easy-to-reach places.  If you need to reach something above you, use a strong step stool that has a grab bar.  Keep electrical cords out of the way.  Do not use floor polish or wax that makes floors slippery. If you must use wax, use non-skid floor wax.  Do not have throw rugs and other things on the floor that can make you trip. What  can I do with my stairs?  Do not leave any items on the stairs.  Make sure that there are handrails on both sides of the stairs and use them. Fix handrails that are broken or loose. Make sure that handrails are as long as the stairways.  Check any carpeting to make sure that it is firmly attached to the stairs. Fix any carpet that is loose or worn.  Avoid having throw rugs at the top or bottom of the stairs. If you do have throw rugs, attach them to the floor with carpet tape.  Make sure that you have a light switch at the top of the stairs and the bottom of the stairs. If you do not have them, ask someone to add them for you. What else can I do to help prevent falls?  Wear shoes that:  Do not have high heels.  Have rubber bottoms.  Are comfortable and fit you well.  Are closed at the toe. Do not wear sandals.  If you use a stepladder:  Make sure that it is fully opened. Do not climb a closed  stepladder.  Make sure that both sides of the stepladder are locked into place.  Ask someone to hold it for you, if possible.  Clearly mark and make sure that you can see:  Any grab bars or handrails.  First and last steps.  Where the edge of each step is.  Use tools that help you move around (mobility aids) if they are needed. These include:  Canes.  Walkers.  Scooters.  Crutches.  Turn on the lights when you go into a dark area. Replace any light bulbs as soon as they burn out.  Set up your furniture so you have a clear path. Avoid moving your furniture around.  If any of your floors are uneven, fix them.  If there are any pets around you, be aware of where they are.  Review your medicines with your doctor. Some medicines can make you feel dizzy. This can increase your chance of falling. Ask your doctor what other things that you can do to help prevent falls. This information is not intended to replace advice given to you by your health care provider. Make sure you discuss any questions you have with your health care provider. Document Released: 02/09/2009 Document Revised: 09/21/2015 Document Reviewed: 05/20/2014 Elsevier Interactive Patient Education  2017 [employer].

## 2018-11-30 NOTE — Progress Notes (Signed)
Subjective:   Cheyenne Cummings is a 73 y.o. female who presents for Medicare Annual (Subsequent) preventive examination.  Virtual Visit via Telephone Note  I connected with Cheyenne Cummings on 11/30/18 at 10:00 AM EDT by telephone and verified that I am speaking with the correct person using two identifiers.  Medicare Annual Wellness visit completed telephonically due to Covid-19 pandemic.   Location: Patient: home Provider: office   I discussed the limitations, risks, security and privacy concerns of performing an evaluation and management service by telephone and the availability of in person appointments. The patient expressed understanding and agreed to proceed.  Some vital signs may be absent or patient reported.   Cheyenne Marker, LPN  Review of Systems:   Cardiac Risk Factors include: advanced age (>90men, >75 women);hypertension;dyslipidemia     Objective:     Vitals: BP 136/82   Pulse 72   Ht 5\' 4"  (1.626 m)   Wt 142 lb (64.4 kg)   BMI 24.37 kg/m   Body mass index is 24.37 kg/m.  Advanced Directives 11/30/2018 11/24/2017 11/07/2017 10/02/2016 11/03/2015 09/18/2015 06/26/2015  Does Patient Have a Medical Advance Directive? Yes Yes Yes Yes No Yes Yes  Type of Paramedic of [place];Living will [place];Living will Living will;Healthcare Power of Attorney Living will - Healthcare Power of [place]  Does patient want to make changes to medical advance directive? - - - - - - -  Copy of [place] in Chart? No - copy requested Yes No - copy requested - - - -  Would patient like information on creating a medical advance directive? - - - - No - patient declined information - -    Tobacco Social History   Tobacco Use  Smoking Status Former Smoker  . Packs/day: 0.50  . Years: 50.00  . Pack years: 25.00  . Types: Cigarettes  . Quit date: 10/12/2018  . Years since quitting: 0.1  Smokeless  Tobacco Never Used     Counseling given: Not Answered   Clinical Intake:  Pre-visit preparation completed: Yes  Pain : No/denies pain     BMI - recorded: 24.37 Nutritional Status: BMI of 19-24  Normal Nutritional Risks: None Diabetes: No  How often do you need to have someone help you when you read instructions, pamphlets, or other written materials from your doctor or pharmacy?: 1 - Never  Interpreter Needed?: No  Information entered by :: Cheyenne Marker LPN  Past Medical History:  Diagnosis Date  . Anxiety disorder   . Breast cancer ([place]) 01/27/2013   right DCIS lumpectomy, radiation   . Edema extremities   . Family history of aneurysm 10/19/14   father died from aortic aneurysm   . Herpes simplex   . Hyperlipidemia   . Hypertension   . Hypothyroidism   . Leg pain 02/11/2014  . Osteoarthritis   . Personal history of radiation therapy   . Personal history of tobacco use, presenting hazards to health October 19, 2014   Past Surgical History:  Procedure Laterality Date  . ARTHROSCOPY WITH ANTERIOR CRUCIATE LIGAMENT (ACL) REPAIR WITH ANTERIOR TIBILIAS GRAFT Bilateral 1996, 2003  . BREAST BIOPSY Right 10/27/15   Benign - fat necrosis/post rad changes  . BREAST BIOPSY Right 02/25/2013   DCIS  . BREAST LUMPECTOMY Right 2014   DCIS - XRT , clear margins and negative LN  . BUNIONECTOMY Right 2015  . FOOT SURGERY Right 2018  . TONSILLECTOMY    .  TOTAL HIP ARTHROPLASTY  2010  . TYMPANOPLASTY     Family History  Problem Relation Age of Onset  . Stroke Mother   . Hypertension Mother   . Kidney disease Mother   . Hypertension Father   . Aneurysm Father   . Cancer Maternal Grandmother   . Breast cancer Other 42       niece   Social History   Socioeconomic History  . Marital status: Married    Spouse name: Not on file  . Number of children: 2  . Years of education: Not on file  . Highest education level: Master's degree (e.g., MA, MS, MEng, MEd, MSW, MBA)  Occupational  History  . Occupation: Retired  Scientific laboratory technician  . Financial resource strain: Not hard at all  . Food insecurity    Worry: Never true    Inability: Never true  . Transportation needs    Medical: No    Non-medical: No  Tobacco Use  . Smoking status: Former Smoker    Packs/day: 0.50    Years: 50.00    Pack years: 25.00    Types: Cigarettes    Quit date: 10/12/2018    Years since quitting: 0.1  . Smokeless tobacco: Never Used  Substance and Sexual Activity  . Alcohol use: Yes    Alcohol/week: 0.0 standard drinks    Comment: occ  . Drug use: No  . Sexual activity: Not Currently  Lifestyle  . Physical activity    Days per week: 0 days    Minutes per session: 0 min  . Stress: Not at all  Relationships  . Social connections    Talks on phone: More than three times a week    Gets together: Three times a week    Attends religious service: More than 4 times per year    Active member of club or organization: No    Attends meetings of clubs or organizations: Never    Relationship status: Married  Other Topics Concern  . Not on file  Social History Narrative  . Not on file    Outpatient Encounter Medications as of 11/30/2018  Medication Sig  . B Complex-C (SUPER B COMPLEX PO) Take by mouth.  . cholecalciferol (VITAMIN D) 1000 UNITS tablet Take 1,000 Units by mouth daily.  . clonazePAM (KLONOPIN) 0.5 MG tablet Take 0.5 mg by mouth daily.   . cyanocobalamin 1000 MCG tablet Take 1 tablet by mouth daily.   Marland Kitchen gabapentin (NEURONTIN) 300 MG capsule Take 300 mg by mouth 2 (two) times daily.   Marland Kitchen levothyroxine (SYNTHROID, LEVOTHROID) 75 MCG tablet TAKE 1 TABLET DAILY  . loratadine (CLARITIN) 10 MG tablet Take 10 mg by mouth daily.  Marland Kitchen losartan (COZAAR) 50 MG tablet TAKE 1 TABLET DAILY  . lovastatin (MEVACOR) 40 MG tablet TAKE 1 TABLET AT BEDTIME  . meloxicam (MOBIC) 15 MG tablet Take 15 mg by mouth daily.  . Multiple Vitamins-Minerals (MULTIVITAMIN WITH MINERALS) tablet Take 1 tablet by  mouth daily.  . NON FORMULARY CPAP @@ 8 cm H2O  . venlafaxine XR (EFFEXOR-XR) 75 MG 24 hr capsule Take 1 capsule by mouth one time daily  . [DISCONTINUED] fexofenadine (ALLEGRA) 180 MG tablet Take 1 tablet by mouth daily. PRN   No facility-administered encounter medications on file as of 11/30/2018.     Activities of Daily Living In your present state of health, do you have any difficulty performing the following activities: 11/30/2018  Hearing? Y  Comment wears hearing aids  Vision? N  Comment wears glasses  Difficulty concentrating or making decisions? N  Walking or climbing stairs? N  Dressing or bathing? N  Doing errands, shopping? N  Preparing Food and eating ? N  Using the Toilet? N  In the past six months, have you accidently leaked urine? Y  Comment wears liners for protection  Do you have problems with loss of bowel control? N  Managing your Medications? N  Managing your Finances? N  Housekeeping or managing your Housekeeping? N  Some recent data might be hidden    Patient Care Team: Glean Hess, MD as PCP - General (Internal Medicine) Hobson-Webb, Preston Fleeting, MD as Referring Physician (Neurology) Waynetta Pean, MD as Consulting Physician (Surgery) Nila Nephew, MD as Consulting Physician (Orthopedic Surgery) Hershal Coria (Inactive) as Referring Physician    Assessment:   This is a routine wellness examination for [place].  Exercise Activities and Dietary recommendations Current Exercise Habits: The patient does not participate in regular exercise at present, Exercise limited by: orthopedic condition(s)  Goals    . DIET - INCREASE WATER INTAKE     Recommend to drink at least 6-8 8oz glasses of water per day.    . Increase physical activity     Pt would like to increase physical activity to at least 3 days per week       Fall Risk Fall Risk  11/30/2018 11/11/2018 05/11/2018 11/24/2017 11/06/2017  Falls in the past year? 1 1 0 No No  Number falls in past  yr: 1 1 0 - -  Injury with Fall? 0 0 0 - -  Risk for fall due to : History of fall(s);Impaired balance/gait History of fall(s);Impaired balance/gait - Impaired vision;History of fall(s);Other (Comment) -  Risk for fall due to: Comment - - - wears eyeglasses; neuropathy; foot surgery -  Follow up Falls prevention discussed Falls evaluation completed;Falls prevention discussed Falls evaluation completed - -   FALL RISK PREVENTION PERTAINING TO THE HOME:  Any stairs in or around the home? Yes  If so, do they handrails? Yes   Home free of loose throw rugs in walkways, pet beds, electrical cords, etc? Yes  Adequate lighting in your home to reduce risk of falls? Yes   ASSISTIVE DEVICES UTILIZED TO PREVENT FALLS:  Life alert? No  Use of a cane, walker or w/c? Yes  - occasional use of cane Grab bars in the bathroom? Yes  Shower chair or bench in shower? Yes  Elevated toilet seat or a handicapped toilet? No   DME ORDERS:  DME order needed?  No   TIMED UP AND GO:  Was the test performed? No . Telephonic visit.   Education: Fall risk prevention has been discussed.  Intervention(s) required? No    Depression Screen PHQ 2/9 Scores 11/30/2018 11/11/2018 05/11/2018 11/24/2017  PHQ - 2 Score 0 0 0 0  PHQ- 9 Score - 0 - 0     Cognitive Function     6CIT Screen 11/30/2018 11/24/2017 10/02/2016  What Year? 0 points 0 points 0 points  What month? 0 points 0 points 0 points  What time? 0 points 0 points 0 points  Count back from 20 0 points 0 points 0 points  Months in reverse 0 points 0 points 0 points  Repeat phrase 0 points 0 points 0 points  Total Score 0 0 0    Immunization History  Administered Date(s) Administered  . Influenza, High Dose Seasonal PF 02/03/2018  .  Influenza,inj,Quad PF,6+ Mos 01/12/2015, 03/06/2016  . Influenza-Unspecified 02/17/2017, 02/03/2018  . Pneumococcal Conjugate-13 01/12/2015  . Pneumococcal Polysaccharide-23 04/30/2009, 10/02/2016  . Tdap 04/30/2009  .  Zoster 04/30/2010  . Zoster Recombinat (Shingrix) 02/10/2018, 04/26/2018    Qualifies for Shingles Vaccine? Yes  Zostavax completed 2012. Shingrix series completed 04/26/18.  Tdap: Up to date  Flu Vaccine: Up to date  Pneumococcal Vaccine: Up to date    Screening Tests Health Maintenance  Topic Date Due  . COLONOSCOPY  06/28/2018  . INFLUENZA VACCINE  11/28/2018  . MAMMOGRAM  02/10/2019  . TETANUS/TDAP  05/01/2019  . DEXA SCAN  Completed  . PNA vac Low Risk Adult  Completed  . Hepatitis C Screening  Discontinued    Cancer Screenings:  Colorectal Screening: Completed 07/20/13. Repeat every 5 years. Scheduled for 01/2019.  Mammogram: Completed 02/09/18. Repeat every year. Ordered 11/11/18. Pt provided with contact information and advised to call to schedule appt.   Bone Density: Completed 02/09/18. Results reflect  OSTEOPENIA. Repeat every 2 years.  Lung Cancer Screening: (Low Dose CT Chest recommended if Age 34-80 years, 30 pack-year currently smoking OR have quit w/in 15years.) does not qualify.   Additional Screening:  Hepatitis C Screening: does qualify; postponed  Vision Screening: Recommended annual ophthalmology exams for early detection of glaucoma and other disorders of the eye. Is the patient up to date with their annual eye exam?  Yes  Who is the provider or what is the name of the office in which the pt attends annual eye exams? [facility] Screening: Recommended annual dental exams for proper oral hygiene  Community Resource Referral:  CRR required this visit?  No        Plan:     I have personally reviewed and addressed the Medicare Annual Wellness questionnaire and have noted the following in the patient's chart:  A. Medical and social history B. Use of alcohol, tobacco or illicit drugs  C. Current medications and supplements D. Functional ability and status E.  Nutritional status F.  Physical activity G. Advance directives H. List  of other physicians I.  Hospitalizations, surgeries, and ER visits in previous 12 months J.  [place] such as hearing and vision if needed, cognitive and depression L. Referrals and appointments   In addition, I have reviewed and discussed with patient certain preventive protocols, quality metrics, and best practice recommendations. A written personalized care plan for preventive services as well as general preventive health recommendations were provided to patient.   Signed,  Cheyenne Marker, LPN Nurse Health Advisor   Nurse Notes: pt doing well and appreciative of visit today

## 2018-12-17 ENCOUNTER — Encounter: Payer: Self-pay | Admitting: Internal Medicine

## 2018-12-17 ENCOUNTER — Other Ambulatory Visit: Payer: Self-pay | Admitting: Internal Medicine

## 2018-12-17 DIAGNOSIS — Z1231 Encounter for screening mammogram for malignant neoplasm of breast: Secondary | ICD-10-CM

## 2018-12-22 DIAGNOSIS — R69 Illness, unspecified: Secondary | ICD-10-CM | POA: Diagnosis not present

## 2019-01-25 ENCOUNTER — Ambulatory Visit: Payer: Medicare HMO | Admitting: Podiatry

## 2019-01-25 ENCOUNTER — Other Ambulatory Visit: Payer: Self-pay

## 2019-01-25 ENCOUNTER — Ambulatory Visit: Payer: Medicare HMO

## 2019-01-25 ENCOUNTER — Encounter: Payer: Self-pay | Admitting: Podiatry

## 2019-01-25 DIAGNOSIS — L600 Ingrowing nail: Secondary | ICD-10-CM

## 2019-01-25 DIAGNOSIS — M778 Other enthesopathies, not elsewhere classified: Secondary | ICD-10-CM

## 2019-01-25 MED ORDER — NEOMYCIN-POLYMYXIN-HC 1 % OT SOLN: OTIC | 1 refills | Status: DC

## 2019-01-25 NOTE — Progress Notes (Signed)
Subjective:  Patient ID: Cheyenne Cummings, female    DOB: 25-Dec-1945,  MRN: [id number] HPI Chief Complaint  Patient presents with  . Nail Problem    Patient presents today for painful fungal, ingrown toenail right hallux x 6 months.  She states "it hurts with any pressure or wearing closed toed shoes"  She has only had pedicures to help    73 y.o. female presents with the above complaint.   ROS: Denies fever chills nausea vomiting muscle aches pains calf pain back pain chest pain shortness of breath.  Past Medical History:  Diagnosis Date  . Anxiety disorder   . Breast cancer ([place]) 01/27/2013   right DCIS lumpectomy, radiation   . Edema extremities   . Family history of aneurysm 09-Oct-2014   father died from aortic aneurysm   . Herpes simplex   . Hyperlipidemia   . Hypertension   . Hypothyroidism   . Leg pain 02/11/2014  . Osteoarthritis   . Personal history of radiation therapy   . Personal history of tobacco use, presenting hazards to health 10-09-2014   Past Surgical History:  Procedure Laterality Date  . ARTHROSCOPY WITH ANTERIOR CRUCIATE LIGAMENT (ACL) REPAIR WITH ANTERIOR TIBILIAS GRAFT Bilateral 1996, 2003  . BREAST BIOPSY Right 10/27/15   Benign - fat necrosis/post rad changes  . BREAST BIOPSY Right 02/25/2013   DCIS  . BREAST LUMPECTOMY Right 2014   DCIS - XRT , clear margins and negative LN  . BUNIONECTOMY Right 2015  . FOOT SURGERY Right 2018  . TONSILLECTOMY    . TOTAL HIP ARTHROPLASTY  2010  . TYMPANOPLASTY      Current Outpatient Medications:  .  acyclovir (ZOVIRAX) 800 MG tablet, TAKE TWO TABLETS BY MOUTH AT ONSET OF SYMPTOMS REPEAT EVERY 12 HOURS FOR 3 DAYS, Disp: , Rfl:  .  B Complex-C (SUPER B COMPLEX PO), Take by mouth., Disp: , Rfl:  .  cholecalciferol (VITAMIN D) 1000 UNITS tablet, Take 1,000 Units by mouth daily., Disp: , Rfl:  .  clonazePAM (KLONOPIN) 0.5 MG tablet, Take 0.5 mg by mouth daily. , Disp: , Rfl:  .  cyanocobalamin 1000 MCG tablet, Take 1  tablet by mouth daily. , Disp: , Rfl:  .  gabapentin (NEURONTIN) 300 MG capsule, Take 300 mg by mouth 2 (two) times daily. , Disp: , Rfl:  .  levothyroxine (SYNTHROID, LEVOTHROID) 75 MCG tablet, TAKE 1 TABLET DAILY, Disp: 90 tablet, Rfl: 3 .  loratadine (CLARITIN) 10 MG tablet, Take 10 mg by mouth daily., Disp: , Rfl:  .  losartan (COZAAR) 50 MG tablet, TAKE 1 TABLET DAILY, Disp: 90 tablet, Rfl: 3 .  lovastatin (MEVACOR) 40 MG tablet, TAKE 1 TABLET AT BEDTIME, Disp: 90 tablet, Rfl: 3 .  meloxicam (MOBIC) 15 MG tablet, Take 15 mg by mouth daily., Disp: , Rfl:  .  Multiple Vitamins-Minerals (MULTIVITAMIN WITH MINERALS) tablet, Take 1 tablet by mouth daily., Disp: , Rfl:  .  NEOMYCIN-POLYMYXIN-HYDROCORTISONE (CORTISPORIN) 1 % SOLN OTIC solution, Apply 1-2 drops to toe BID after soaking, Disp: 10 mL, Rfl: 1 .  NON FORMULARY, CPAP @@ 8 cm H2O, Disp: , Rfl:  .  venlafaxine XR (EFFEXOR-XR) 75 MG 24 hr capsule, Take 1 capsule by mouth one time daily, Disp: 90 capsule, Rfl: 3  No Known Allergies Review of Systems Objective:  There were no vitals filed for this visit.  General: Well developed, nourished, in no acute distress, alert and oriented x3   Dermatological: Skin is warm, dry  and supple bilateral. Nails x 10 are well maintained; remaining integument appears unremarkable at this time. There are no open sores, no preulcerative lesions, no rash or signs of infection present.  Ingrown nail fibular border hallux right with mild erythema exquisitely tender on palpation.  Mild onychodystrophy present.  Vascular: Dorsalis Pedis artery and Posterior Tibial artery pedal pulses are 2/4 bilateral with immedate capillary fill time. Pedal hair growth present. No varicosities and no lower extremity edema present bilateral.   Neruologic: Grossly intact via light touch bilateral. Vibratory intact via tuning fork bilateral. Protective threshold with Semmes Wienstein monofilament intact to all pedal sites  bilateral. Patellar and Achilles deep tendon reflexes 2+ bilateral. No Babinski or clonus noted bilateral.   Musculoskeletal: No gross boney pedal deformities bilateral. No pain, crepitus, or limitation noted with foot and ankle range of motion bilateral. Muscular strength 5/5 in all groups tested bilateral.  Fused first metatarsal phalangeal joint resulting in hallux extensiveness right.  Gait: Unassisted, Nonantalgic.    Radiographs:  None taken  Assessment & Plan:   Assessment: Ingrown toenail fibular border right.  Nail dystrophy hallux right.  Plan: Chemical matrixectomy was performed today after local anesthetic was administered she tolerated the procedure well without complications.  She was provided with both oral and written home-going instructions for the care and soaking of the toe.  She is also received a prescription for Cortisporin Otic to be applied twice daily after soaking.  Follow-up with her in 1 month.     Max T. D[place], [place]

## 2019-01-25 NOTE — Patient Instructions (Signed)

## 2019-01-26 DIAGNOSIS — G4733 Obstructive sleep apnea (adult) (pediatric): Secondary | ICD-10-CM | POA: Diagnosis not present

## 2019-02-08 ENCOUNTER — Other Ambulatory Visit: Payer: Self-pay

## 2019-02-08 ENCOUNTER — Ambulatory Visit: Payer: Medicare HMO | Admitting: Podiatry

## 2019-02-08 ENCOUNTER — Encounter: Payer: Self-pay | Admitting: Podiatry

## 2019-02-08 DIAGNOSIS — Z9889 Other specified postprocedural states: Secondary | ICD-10-CM

## 2019-02-08 DIAGNOSIS — L600 Ingrowing nail: Secondary | ICD-10-CM

## 2019-02-08 NOTE — Progress Notes (Signed)
She presents today for follow-up of the matrixectomy.  She states that it still bleeds some but all in all is doing better.  Is not soaking any longer.  Objective: Vital signs are stable alert and oriented x3.  Pulses are palpable.  Matrixectomy's are healing very well hallux right.  There is no erythema cellulitis drainage or odor.  Assessment: Well-healing surgical foot.  Plan: I highly recommend she continue to soak every day at least once daily with Epson salts and warm water.  This should alleviate any soreness or tenderness that remains I will follow-up with her on an as-needed basis.

## 2019-02-08 NOTE — Patient Instructions (Signed)

## 2019-02-11 ENCOUNTER — Ambulatory Visit
Admission: RE | Admit: 2019-02-11 | Discharge: 2019-02-11 | Disposition: A | Discharge status: Home / Self Care | Payer: Medicare HMO | Source: Ambulatory Visit | Attending: Internal Medicine | Admitting: Internal Medicine

## 2019-02-11 ENCOUNTER — Other Ambulatory Visit: Payer: Self-pay

## 2019-02-11 DIAGNOSIS — Z1231 Encounter for screening mammogram for malignant neoplasm of breast: Secondary | ICD-10-CM | POA: Insufficient documentation

## 2019-02-15 ENCOUNTER — Other Ambulatory Visit: Payer: Self-pay

## 2019-02-15 ENCOUNTER — Encounter: Payer: Self-pay | Admitting: Internal Medicine

## 2019-02-15 ENCOUNTER — Ambulatory Visit (INDEPENDENT_AMBULATORY_CARE_PROVIDER_SITE_OTHER): Payer: Medicare HMO | Admitting: Internal Medicine

## 2019-02-15 VITALS — BP 124/78 | HR 78 | Ht 64.0 in | Wt 151.0 lb

## 2019-02-15 DIAGNOSIS — E034 Atrophy of thyroid (acquired): Secondary | ICD-10-CM | POA: Diagnosis not present

## 2019-02-15 DIAGNOSIS — E7439 Other disorders of intestinal carbohydrate absorption: Secondary | ICD-10-CM | POA: Diagnosis not present

## 2019-02-15 DIAGNOSIS — I10 Essential (primary) hypertension: Secondary | ICD-10-CM | POA: Diagnosis not present

## 2019-02-15 DIAGNOSIS — G6282 Radiation-induced polyneuropathy: Secondary | ICD-10-CM | POA: Diagnosis not present

## 2019-02-15 NOTE — Progress Notes (Signed)
Date:  02/15/2019   Name:  Cheyenne Cummings   DOB:  08-May-1945   MRN:  [id number]   Chief Complaint: Weight Gain (Concerned this is coming with thyroid problem. Trying to eat right and exercising and no weight loss yet. ) and Hypoglycemia (On and off gets "fuzzy headed" in the morning. Checked many years ago and was borderline low. Wants rechecked to be sure okay. )  Thyroid Problem Presents for follow-up visit. Symptoms include anxiety and weight gain (10 lbs). Patient reports no constipation, depressed mood, diarrhea, fatigue, hair loss, heat intolerance, leg swelling, palpitations or tremors.  Hypertension This is a chronic problem. The problem is controlled. Pertinent negatives include no chest pain, headaches, palpitations or shortness of breath. Past treatments include angiotensin blockers. The current treatment provides significant improvement. Identifiable causes of hypertension include a thyroid problem.  Low blood sugar - pt states that she was tested years ago for low blood sugar and was told that she needed to eat more protein and less sweets which she has done for years.  Lately she feels that is may be worsening - she feels groggy and out of sorts at times - improves if she eats something.  She is not eating much gluten - restricts herself to one piece of bread per day.  Eating more fiber and protein.  Review of Systems  Constitutional: Positive for unexpected weight change and weight gain (10 lbs). Negative for chills and fatigue.  HENT: Negative for trouble swallowing.   Respiratory: Negative for cough, chest tightness and shortness of breath.   Cardiovascular: Negative for chest pain, palpitations and leg swelling.  Gastrointestinal: Positive for abdominal distention (upper abdomen feels full). Negative for abdominal pain, blood in stool, constipation and diarrhea.  Endocrine: Negative for heat intolerance.  Genitourinary: Negative for difficulty urinating.  Musculoskeletal:  Positive for arthralgias (left hip) and gait problem.  Neurological: Positive for numbness (neuropathic pain). Negative for dizziness, tremors, weakness, light-headedness and headaches.  Psychiatric/Behavioral: Negative for decreased concentration, dysphoric mood and sleep disturbance. The patient is nervous/anxious.     Patient Active Problem List   Diagnosis Date Noted  . Ectatic thoracic aorta ([place]) 11/09/2018  . Chronic hip pain, left 09/08/2018  . History of total hip replacement, left 09/08/2018  . Post-traumatic osteoarthritis of both knees 09/08/2018  . OSA on CPAP 05/11/2018  . Osteopenia of right hip 11/06/2017  . Hypothyroidism due to acquired atrophy of thyroid 10/02/2016  . Tinnitus of right ear 05/01/2016  . Ductal carcinoma in situ (DCIS) of right breast 09/18/2015  . Tobacco use disorder, moderate, in sustained remission, dependence 09/18/2015  . Alphaherpesviral disease 10/01/2014  . Hyperlipidemia 10/01/2014  . Anxiety disorder due to known physiological condition 10/01/2014  . Environmental and seasonal allergies 10/01/2014  . BB block 10/01/2014  . Edema extremities 10/01/2014  . Essential (primary) hypertension 10/01/2014  . History of colon polyps 10/01/2014  . Arthritis of knee, degenerative 10/01/2014  . Addison anemia 10/01/2014  . Plantar fasciitis 02/11/2014  . Radiation-induced polyneuropathy ([place]) 02/11/2014    No Known Allergies  Past Surgical History:  Procedure Laterality Date  . ARTHROSCOPY WITH ANTERIOR CRUCIATE LIGAMENT (ACL) REPAIR WITH ANTERIOR TIBILIAS GRAFT Bilateral 1996, 2003  . BREAST BIOPSY Right 10/27/15   Benign - fat necrosis/post rad changes  . BREAST BIOPSY Right 02/25/2013   DCIS  . BREAST LUMPECTOMY Right 2014   DCIS - XRT , clear margins and negative LN  . BUNIONECTOMY Right 2015  . FOOT  SURGERY Right 2018  . TONSILLECTOMY    . TOTAL HIP ARTHROPLASTY  2010  . TYMPANOPLASTY      Social History   Tobacco Use  .  Smoking status: Former Smoker    Packs/day: 0.50    Years: 50.00    Pack years: 25.00    Types: Cigarettes    Quit date: 10/12/2018    Years since quitting: 0.3  . Smokeless tobacco: Never Used  Substance Use Topics  . Alcohol use: Yes    Alcohol/week: 0.0 standard drinks    Comment: occ  . Drug use: No     Medication list has been reviewed and updated.  Current Meds  Medication Sig  . acyclovir (ZOVIRAX) 800 MG tablet TAKE TWO TABLETS BY MOUTH AT ONSET OF SYMPTOMS REPEAT EVERY 12 HOURS FOR 3 DAYS  . B Complex-C (SUPER B COMPLEX PO) Take by mouth.  . cholecalciferol (VITAMIN D) 1000 UNITS tablet Take 1,000 Units by mouth daily.  . clonazePAM (KLONOPIN) 0.5 MG tablet Take 0.5 mg by mouth daily.   . cyanocobalamin 1000 MCG tablet Take 1 tablet by mouth daily.   Marland Kitchen gabapentin (NEURONTIN) 300 MG capsule Take 300 mg by mouth 2 (two) times daily.   Marland Kitchen levothyroxine (SYNTHROID, LEVOTHROID) 75 MCG tablet TAKE 1 TABLET DAILY  . loratadine (CLARITIN) 10 MG tablet Take 10 mg by mouth daily.  Marland Kitchen losartan (COZAAR) 50 MG tablet TAKE 1 TABLET DAILY  . lovastatin (MEVACOR) 40 MG tablet TAKE 1 TABLET AT BEDTIME  . meloxicam (MOBIC) 15 MG tablet Take 15 mg by mouth daily.  . Multiple Vitamins-Minerals (MULTIVITAMIN WITH MINERALS) tablet Take 1 tablet by mouth daily.  . NEOMYCIN-POLYMYXIN-HYDROCORTISONE (CORTISPORIN) 1 % SOLN OTIC solution Apply 1-2 drops to toe BID after soaking  . NON FORMULARY CPAP @@ 8 cm H2O  . venlafaxine XR (EFFEXOR-XR) 75 MG 24 hr capsule Take 1 capsule by mouth one time daily    PHQ 2/9 Scores 02/15/2019 11/30/2018 11/11/2018 05/11/2018  PHQ - 2 Score 0 0 0 0  PHQ- 9 Score - - 0 -    BP Readings from Last 3 Encounters:  02/15/19 124/78  11/30/18 136/82  11/11/18 102/68    Physical Exam Vitals signs and nursing note reviewed.  Constitutional:      General: She is not in acute distress.    Appearance: Normal appearance. She is well-developed.  HENT:     Head:  Normocephalic and atraumatic.  Neck:     Musculoskeletal: Normal range of motion.     Vascular: No carotid bruit.  Cardiovascular:     Rate and Rhythm: Normal rate and regular rhythm.     Pulses: Normal pulses.     Heart sounds: No murmur.  Pulmonary:     Effort: Pulmonary effort is normal. No respiratory distress.     Breath sounds: No wheezing or rhonchi.  Musculoskeletal:     Right lower leg: No edema.     Left lower leg: No edema.  Lymphadenopathy:     Cervical: No cervical adenopathy.  Skin:    General: Skin is warm and dry.     Capillary Refill: Capillary refill takes less than 2 seconds.     Findings: No rash.  Neurological:     General: No focal deficit present.     Mental Status: She is alert and oriented to person, place, and time.  Psychiatric:        Behavior: Behavior normal.        Thought  Content: Thought content normal.     Wt Readings from Last 3 Encounters:  02/15/19 151 lb (68.5 kg)  11/30/18 142 lb (64.4 kg)  11/13/18 141 lb (64 kg)    BP 124/78   Pulse 78   Ht 5\' 4"  (1.626 m)   Wt 151 lb (68.5 kg)   SpO2 97%   BMI 25.92 kg/m   Assessment and Plan: 1. Hypothyroidism due to acquired atrophy of thyroid Will check full panel for reassurance Continue same dose for now - TSH+T4F+T3Free  2. Essential (primary) hypertension Clinically stable exam with well controlled BP.   Tolerating medications, losartan 50 mg, without side effects at this time. Pt to continue current regimen and low sodium diet; benefits of regular exercise as able discussed. - Comprehensive metabolic panel  3. Glucose intolerance Pt reports long hx of low blood sugars - she eats higher protein meals more often to control the symptoms Reviewed meds for those causing weight gain - nothing is new and no dosage changes in the recent past - Hemoglobin A1c  4. Neuropathy due to ionizing radiation ([place]) Stable, continue gabapentin   Partially dictated using Editor, commissioning. Any  errors are unintentional.  Halina Maidens, MD [facility]  02/15/2019

## 2019-02-16 LAB — COMPREHENSIVE METABOLIC PANEL
ALT: 22 IU/L (ref 0–32)
AST: 24 IU/L (ref 0–40)
Albumin/Globulin Ratio: 2 (ref 1.2–2.2)
Albumin: 4.5 g/dL (ref 3.7–4.7)
Alkaline Phosphatase: 84 IU/L (ref 39–117)
BUN/Creatinine Ratio: 19 (ref 12–28)
BUN: 14 mg/dL (ref 8–27)
Bilirubin Total: 0.3 mg/dL (ref 0.0–1.2)
CO2: 23 mmol/L (ref 20–29)
Calcium: 9.6 mg/dL (ref 8.7–10.3)
Chloride: 99 mmol/L (ref 96–106)
Creatinine, Ser: 0.75 mg/dL (ref 0.57–1.00)
GFR calc Af Amer: 92 mL/min/{1.73_m2} (ref >59)
GFR calc non Af Amer: 80 mL/min/{1.73_m2} (ref >59)
Globulin, Total: 2.3 g/dL (ref 1.5–4.5)
Glucose: 86 mg/dL (ref 65–99)
Potassium: 4.9 mmol/L (ref 3.5–5.2)
Sodium: 135 mmol/L (ref 134–144)
Total Protein: 6.8 g/dL (ref 6.0–8.5)

## 2019-02-16 LAB — TSH+T4F+T3FREE
Free T4: 1.35 ng/dL (ref 0.82–1.77)
T3, Free: 2.7 pg/mL (ref 2.0–4.4)
TSH: 0.944 u[IU]/mL (ref 0.450–4.500)

## 2019-02-16 LAB — HEMOGLOBIN A1C
Est. average glucose Bld gHb Est-mCnc: 108 mg/dL
Hgb A1c MFr Bld: 5.4 % (ref 4.8–5.6)

## 2019-02-19 DIAGNOSIS — Z01812 Encounter for preprocedural laboratory examination: Secondary | ICD-10-CM | POA: Diagnosis not present

## 2019-02-19 DIAGNOSIS — Z20828 Contact with and (suspected) exposure to other viral communicable diseases: Secondary | ICD-10-CM | POA: Diagnosis not present

## 2019-02-22 DIAGNOSIS — K648 Other hemorrhoids: Secondary | ICD-10-CM | POA: Diagnosis not present

## 2019-02-22 DIAGNOSIS — K5730 Diverticulosis of large intestine without perforation or abscess without bleeding: Secondary | ICD-10-CM | POA: Diagnosis not present

## 2019-02-22 DIAGNOSIS — Z86010 Personal history of colonic polyps: Secondary | ICD-10-CM | POA: Diagnosis not present

## 2019-02-22 DIAGNOSIS — Z1211 Encounter for screening for malignant neoplasm of colon: Secondary | ICD-10-CM | POA: Diagnosis not present

## 2019-02-23 DIAGNOSIS — H02834 Dermatochalasis of left upper eyelid: Secondary | ICD-10-CM | POA: Diagnosis not present

## 2019-02-23 DIAGNOSIS — H02839 Dermatochalasis of unspecified eye, unspecified eyelid: Secondary | ICD-10-CM | POA: Diagnosis not present

## 2019-02-23 DIAGNOSIS — Z0100 Encounter for examination of eyes and vision without abnormal findings: Secondary | ICD-10-CM | POA: Diagnosis not present

## 2019-02-23 DIAGNOSIS — H2513 Age-related nuclear cataract, bilateral: Secondary | ICD-10-CM | POA: Diagnosis not present

## 2019-02-23 DIAGNOSIS — H04123 Dry eye syndrome of bilateral lacrimal glands: Secondary | ICD-10-CM | POA: Diagnosis not present

## 2019-02-23 DIAGNOSIS — H5203 Hypermetropia, bilateral: Secondary | ICD-10-CM | POA: Diagnosis not present

## 2019-03-12 DIAGNOSIS — Z96642 Presence of left artificial hip joint: Secondary | ICD-10-CM | POA: Diagnosis not present

## 2019-03-12 DIAGNOSIS — M7050 Other bursitis of knee, unspecified knee: Secondary | ICD-10-CM | POA: Diagnosis not present

## 2019-03-15 DIAGNOSIS — J309 Allergic rhinitis, unspecified: Secondary | ICD-10-CM | POA: Diagnosis not present

## 2019-03-15 DIAGNOSIS — E785 Hyperlipidemia, unspecified: Secondary | ICD-10-CM | POA: Diagnosis not present

## 2019-03-15 DIAGNOSIS — G8929 Other chronic pain: Secondary | ICD-10-CM | POA: Diagnosis not present

## 2019-03-15 DIAGNOSIS — R69 Illness, unspecified: Secondary | ICD-10-CM | POA: Diagnosis not present

## 2019-03-15 DIAGNOSIS — G629 Polyneuropathy, unspecified: Secondary | ICD-10-CM | POA: Diagnosis not present

## 2019-03-15 DIAGNOSIS — F329 Major depressive disorder, single episode, unspecified: Secondary | ICD-10-CM | POA: Diagnosis not present

## 2019-03-15 DIAGNOSIS — I10 Essential (primary) hypertension: Secondary | ICD-10-CM | POA: Diagnosis not present

## 2019-03-15 DIAGNOSIS — F419 Anxiety disorder, unspecified: Secondary | ICD-10-CM | POA: Diagnosis not present

## 2019-03-15 DIAGNOSIS — E039 Hypothyroidism, unspecified: Secondary | ICD-10-CM | POA: Diagnosis not present

## 2019-03-15 DIAGNOSIS — B009 Herpesviral infection, unspecified: Secondary | ICD-10-CM | POA: Diagnosis not present

## 2019-03-22 ENCOUNTER — Other Ambulatory Visit: Payer: Self-pay

## 2019-03-22 ENCOUNTER — Ambulatory Visit: Payer: Medicare HMO | Admitting: Podiatry

## 2019-03-22 DIAGNOSIS — L603 Nail dystrophy: Secondary | ICD-10-CM

## 2019-03-22 DIAGNOSIS — B351 Tinea unguium: Secondary | ICD-10-CM | POA: Diagnosis not present

## 2019-03-22 NOTE — Progress Notes (Signed)
She presents today for follow-up of her matrixectomy which she states is doing really well she states that she is also concerned about the discoloration of the toenail.  Objective: Vital signs are stable she is alert oriented x3 hallux right demonstrates no erythema edema cellulitis drainage odor appears to be healing very nicely though it is slightly thickened and discolored distally with some subungual debris.  Assessment: Nail dystrophy cannot rule out onychomycosis resolving matrixectomy.  Plan: Samples of the skin and nail were taken today for pathologic evaluation we will notify her as to those results.

## 2019-03-29 DIAGNOSIS — L570 Actinic keratosis: Secondary | ICD-10-CM | POA: Diagnosis not present

## 2019-03-29 DIAGNOSIS — L578 Other skin changes due to chronic exposure to nonionizing radiation: Secondary | ICD-10-CM | POA: Diagnosis not present

## 2019-03-29 DIAGNOSIS — Z86018 Personal history of other benign neoplasm: Secondary | ICD-10-CM | POA: Diagnosis not present

## 2019-03-29 DIAGNOSIS — Z85820 Personal history of malignant melanoma of skin: Secondary | ICD-10-CM | POA: Diagnosis not present

## 2019-03-29 DIAGNOSIS — Z872 Personal history of diseases of the skin and subcutaneous tissue: Secondary | ICD-10-CM | POA: Diagnosis not present

## 2019-04-12 ENCOUNTER — Other Ambulatory Visit: Payer: Self-pay

## 2019-04-12 MED ORDER — VENLAFAXINE HCL ER 75 MG PO CP24: ORAL_CAPSULE | ORAL | 3 refills | Status: DC

## 2019-04-14 ENCOUNTER — Telehealth: Payer: Self-pay | Admitting: Podiatry

## 2019-04-14 MED ORDER — FLUCONAZOLE 150 MG PO TABS: 300.0000 mg | ORAL_TABLET | ORAL | 0 refills | Status: DC

## 2019-04-14 NOTE — Telephone Encounter (Signed)
Patient called and stated that her toe has healed and she got a biopsy. She stated that Dr. Milinda Pointer would let her know about the results that way she did not have to come back. Patient states that she is not wanting to come back if she doesn't have to. Please call patient, she has called the nurse a few times in [place] this week.

## 2019-04-14 NOTE — Addendum Note (Signed)
Addended by: Clovis Riley E on: 04/14/2019 11:36 AM   Modules accepted: Orders

## 2019-04-14 NOTE — Telephone Encounter (Addendum)
Pathology results were reviewed showing yeast present in her toenails. Dr. Milinda Pointer would like her to start fluconazole, taking 2 tablets once a week. I informed patient and she [facility]. Reviewed instructions for taking medication and she will follow up with [place] in 3 months-call transferred to front desk for appointment to be made.   Blood work will be needed at 3 month follow up visit.

## 2019-04-16 ENCOUNTER — Encounter: Payer: Self-pay | Admitting: Podiatry

## 2019-04-21 NOTE — Telephone Encounter (Signed)
Mail order pharmacy has been contacted and instructed to fill as directed per Dr. Milinda Pointer.

## 2019-04-27 DIAGNOSIS — G4733 Obstructive sleep apnea (adult) (pediatric): Secondary | ICD-10-CM | POA: Diagnosis not present

## 2019-04-28 ENCOUNTER — Ambulatory Visit: Payer: Medicare HMO | Admitting: Podiatry

## 2019-04-29 ENCOUNTER — Telehealth: Payer: Self-pay | Admitting: Podiatry

## 2019-04-29 NOTE — Telephone Encounter (Signed)
Pt called to say that she still has not received her diflucan from the [facility].

## 2019-04-29 NOTE — Telephone Encounter (Signed)
Pt called back has medication now. [facility] and [facility] there.

## 2019-05-11 DIAGNOSIS — G8929 Other chronic pain: Secondary | ICD-10-CM | POA: Diagnosis not present

## 2019-05-11 DIAGNOSIS — M7050 Other bursitis of knee, unspecified knee: Secondary | ICD-10-CM | POA: Diagnosis not present

## 2019-05-11 DIAGNOSIS — M25552 Pain in left hip: Secondary | ICD-10-CM | POA: Diagnosis not present

## 2019-05-11 DIAGNOSIS — Z96642 Presence of left artificial hip joint: Secondary | ICD-10-CM | POA: Diagnosis not present

## 2019-05-11 DIAGNOSIS — T8484XA Pain due to internal orthopedic prosthetic devices, implants and grafts, initial encounter: Secondary | ICD-10-CM | POA: Diagnosis not present

## 2019-05-11 DIAGNOSIS — R69 Illness, unspecified: Secondary | ICD-10-CM | POA: Diagnosis not present

## 2019-06-15 DIAGNOSIS — J301 Allergic rhinitis due to pollen: Secondary | ICD-10-CM | POA: Diagnosis not present

## 2019-06-15 DIAGNOSIS — J3489 Other specified disorders of nose and nasal sinuses: Secondary | ICD-10-CM | POA: Diagnosis not present

## 2019-06-15 DIAGNOSIS — J340 Abscess, furuncle and carbuncle of nose: Secondary | ICD-10-CM | POA: Diagnosis not present

## 2019-06-22 ENCOUNTER — Other Ambulatory Visit: Payer: Self-pay | Admitting: Internal Medicine

## 2019-06-22 DIAGNOSIS — M1711 Unilateral primary osteoarthritis, right knee: Secondary | ICD-10-CM | POA: Diagnosis not present

## 2019-06-22 DIAGNOSIS — G8929 Other chronic pain: Secondary | ICD-10-CM | POA: Diagnosis not present

## 2019-06-22 DIAGNOSIS — M25552 Pain in left hip: Secondary | ICD-10-CM | POA: Diagnosis not present

## 2019-06-22 DIAGNOSIS — M7050 Other bursitis of knee, unspecified knee: Secondary | ICD-10-CM | POA: Diagnosis not present

## 2019-06-22 DIAGNOSIS — G6282 Radiation-induced polyneuropathy: Secondary | ICD-10-CM | POA: Diagnosis not present

## 2019-07-06 ENCOUNTER — Encounter: Payer: Self-pay | Admitting: Internal Medicine

## 2019-07-06 ENCOUNTER — Other Ambulatory Visit: Payer: Self-pay

## 2019-07-06 ENCOUNTER — Ambulatory Visit (INDEPENDENT_AMBULATORY_CARE_PROVIDER_SITE_OTHER): Payer: Medicare HMO | Admitting: Internal Medicine

## 2019-07-06 VITALS — BP 120/80 | HR 91 | Temp 96.6°F | Ht 64.0 in | Wt 151.0 lb

## 2019-07-06 DIAGNOSIS — R69 Illness, unspecified: Secondary | ICD-10-CM | POA: Diagnosis not present

## 2019-07-06 DIAGNOSIS — N814 Uterovaginal prolapse, unspecified: Secondary | ICD-10-CM | POA: Diagnosis not present

## 2019-07-06 DIAGNOSIS — F17200 Nicotine dependence, unspecified, uncomplicated: Secondary | ICD-10-CM

## 2019-07-06 MED ORDER — CHANTIX STARTING MONTH PAK 0.5 MG X 11 & 1 MG X 42 PO TABS: ORAL_TABLET | ORAL | 0 refills | Status: DC

## 2019-07-06 NOTE — Progress Notes (Signed)
Date:  07/06/2019   Name:  Cheyenne Cummings   DOB:  Jun 22, 1945   MRN:  [id number]   Chief Complaint: vaginal problems ("bulb" in there. feels like a ball and "its just there". no pain, no itching, no bleeding )  Vaginal Pain The patient's pertinent negatives include no vaginal discharge (bulge from vagina when standing). This is a chronic problem. The problem has been gradually worsening. The patient is experiencing no pain. Pertinent negatives include no chills, dysuria, fever or frequency. The symptoms are aggravated by activity.  Nicotine Dependence Presents for follow-up visit. Symptoms are negative for fatigue. Her urge triggers include company of smokers. She smokes < 1/2 a pack (started back smoking again and wants Chantix) of cigarettes per day.    Lab Results  Component Value Date   CREATININE 0.75 02/15/2019   BUN 14 02/15/2019   NA 135 02/15/2019   K 4.9 02/15/2019   CL 99 02/15/2019   CO2 23 02/15/2019   Lab Results  Component Value Date   CHOL 156 11/11/2018   HDL 75 11/11/2018   LDLCALC 67 11/11/2018   TRIG 69 11/11/2018   CHOLHDL 2.1 11/11/2018   Lab Results  Component Value Date   TSH 0.944 02/15/2019   Lab Results  Component Value Date   HGBA1C 5.4 02/15/2019     Review of Systems  Constitutional: Negative for chills, fatigue and fever.  Respiratory: Negative for chest tightness and shortness of breath.   Cardiovascular: Negative for chest pain.  Genitourinary: Positive for vaginal pain. Negative for decreased urine volume, difficulty urinating, dysuria, frequency, genital sores and vaginal discharge (bulge from vagina when standing).    Patient Active Problem List   Diagnosis Date Noted  . Chronic hip pain, left 09/08/2018  . History of total hip replacement, left 09/08/2018  . Post-traumatic osteoarthritis of both knees 09/08/2018  . OSA on CPAP 05/11/2018  . Osteopenia of right hip 11/06/2017  . Hypothyroidism due to acquired atrophy of  thyroid 10/02/2016  . Tinnitus of right ear 05/01/2016  . Ductal carcinoma in situ (DCIS) of right breast 09/18/2015  . Tobacco use disorder, moderate, in sustained remission, dependence 09/18/2015  . Alphaherpesviral disease 10/01/2014  . Hyperlipidemia 10/01/2014  . Anxiety disorder due to known physiological condition 10/01/2014  . Environmental and seasonal allergies 10/01/2014  . BB block 10/01/2014  . Edema extremities 10/01/2014  . Essential (primary) hypertension 10/01/2014  . History of colon polyps 10/01/2014  . Arthritis of knee, degenerative 10/01/2014  . Addison anemia 10/01/2014  . Plantar fasciitis 02/11/2014  . Radiation-induced polyneuropathy ([place]) 02/11/2014    No Known Allergies  Past Surgical History:  Procedure Laterality Date  . ARTHROSCOPY WITH ANTERIOR CRUCIATE LIGAMENT (ACL) REPAIR WITH ANTERIOR TIBILIAS GRAFT Bilateral 1996, 2003  . BREAST BIOPSY Right 10/27/15   Benign - fat necrosis/post rad changes  . BREAST BIOPSY Right 02/25/2013   DCIS  . BREAST LUMPECTOMY Right 2014   DCIS - XRT , clear margins and negative LN  . BUNIONECTOMY Right 2015  . FOOT SURGERY Right 2018  . TONSILLECTOMY    . TOTAL HIP ARTHROPLASTY Left 2010  . TYMPANOPLASTY      Social History   Tobacco Use  . Smoking status: Former Smoker    Packs/day: 0.50    Years: 50.00    Pack years: 25.00    Types: Cigarettes    Quit date: 10/12/2018    Years since quitting: 0.7  . Smokeless tobacco: Never Used  Substance Use Topics  . Alcohol use: Yes    Alcohol/week: 0.0 standard drinks    Comment: occ  . Drug use: No     Medication list has been reviewed and updated.  Current Meds  Medication Sig  . acyclovir (ZOVIRAX) 800 MG tablet 800 mg as needed.   . B Complex-C (SUPER B COMPLEX PO) Take by mouth.  . cholecalciferol (VITAMIN D) 1000 UNITS tablet Take 1,000 Units by mouth daily.  . clonazePAM (KLONOPIN) 0.5 MG tablet Take 0.5 mg by mouth daily.   . fluconazole  (DIFLUCAN) 150 MG tablet Take 2 tablets (300 mg total) by mouth once a week.  . gabapentin (NEURONTIN) 300 MG capsule Take 300 mg by mouth 2 (two) times daily.   Marland Kitchen levothyroxine (SYNTHROID, LEVOTHROID) 75 MCG tablet TAKE 1 TABLET DAILY  . loratadine (CLARITIN) 10 MG tablet Take 10 mg by mouth daily.  Marland Kitchen losartan (COZAAR) 50 MG tablet TAKE 1 TABLET DAILY  . lovastatin (MEVACOR) 40 MG tablet TAKE 1 TABLET AT BEDTIME  . mometasone (NASONEX) 50 MCG/ACT nasal spray Place 2 sprays into the nose daily. 50 mcg  . Multiple Vitamins-Minerals (MULTIVITAMIN WITH MINERALS) tablet Take 1 tablet by mouth daily.  . mupirocin nasal ointment (BACTROBAN) 2 % Place 1 application into the nose 2 (two) times daily. Use one-half of tube in each nostril twice daily for five (5) days. After application, press sides of nose together and gently massage.  . NON FORMULARY CPAP @@ 8 cm H2O  . venlafaxine XR (EFFEXOR-XR) 75 MG 24 hr capsule Take 1 capsule by mouth one time daily    PHQ 2/9 Scores 07/06/2019 02/15/2019 11/30/2018 11/11/2018  PHQ - 2 Score 0 0 0 0  PHQ- 9 Score 0 - - 0    BP Readings from Last 3 Encounters:  07/06/19 120/80  02/15/19 124/78  11/30/18 136/82    Physical Exam Vitals and nursing note reviewed.  Constitutional:      General: She is not in acute distress.    Appearance: She is well-developed.  HENT:     Head: Normocephalic and atraumatic.  Pulmonary:     Effort: Pulmonary effort is normal. No respiratory distress.  Genitourinary:    Labia:        Right: No tenderness or lesion.        Left: No tenderness or lesion.      Urethra: No prolapse.     Vagina: Prolapsed vaginal walls present.     Cervix: Normal.     Uterus: Normal.      Comments: Bimanual exam normal except for moderate anterior vaginal prolapse - no pain or bleeding Musculoskeletal:        General: Normal range of motion.     Right lower leg: No edema.     Left lower leg: No edema.  Skin:    General: Skin is warm and  dry.     Findings: No rash.  Neurological:     Mental Status: She is alert and oriented to person, place, and time.  Psychiatric:        Behavior: Behavior normal.        Thought Content: Thought content normal.     Wt Readings from Last 3 Encounters:  07/06/19 151 lb (68.5 kg)  02/15/19 151 lb (68.5 kg)  11/30/18 142 lb (64.4 kg)    BP 120/80   Pulse 91   Temp (!) 96.6 F (35.9 C) (Temporal)   Ht 5\' 4"  (1.626 m)  Wt 151 lb (68.5 kg)   SpO2 99%   BMI 25.92 kg/m   Assessment and Plan: 1. Cystocele with prolapse Refer to [place] - Ambulatory referral to Urogynecology  2. Tobacco use disorder - varenicline (CHANTIX STARTING MONTH PAK) 0.5 MG X 11 & 1 MG X 42 tablet; Take one 0.5 mg tablet by mouth once daily for 3 days, then increase to one 0.5 mg tablet twice daily for 4 days, then increase to one 1 mg tablet twice daily.  Dispense: 53 tablet; Refill: 0   Partially dictated using Editor, commissioning. Any errors are unintentional.  Halina Maidens, MD [facility]  07/06/2019

## 2019-07-06 NOTE — Patient Instructions (Signed)
Dr. Cheryl Flash  Dr. Lorenza Chick

## 2019-07-15 ENCOUNTER — Telehealth: Payer: Self-pay

## 2019-07-15 NOTE — Telephone Encounter (Signed)
Call patient and let her know

## 2019-07-15 NOTE — Telephone Encounter (Signed)
Insurance DENIED Chantix. Said patient has to try bupropion HCI first.  Please advise.  CM

## 2019-07-16 ENCOUNTER — Other Ambulatory Visit: Payer: Self-pay | Admitting: Internal Medicine

## 2019-07-16 DIAGNOSIS — F17200 Nicotine dependence, unspecified, uncomplicated: Secondary | ICD-10-CM

## 2019-07-16 MED ORDER — BUPROPION HCL ER (SR) 100 MG PO TB12: 100.0000 mg | ORAL_TABLET | Freq: Two times a day (BID) | ORAL | 0 refills | Status: DC

## 2019-07-16 NOTE — Telephone Encounter (Signed)
Sent to pharmacy.  If not tolerated or helpful, we can probably then get Chantix approved

## 2019-07-21 ENCOUNTER — Other Ambulatory Visit: Payer: Self-pay

## 2019-07-21 ENCOUNTER — Ambulatory Visit: Payer: Medicare HMO | Admitting: Podiatry

## 2019-07-21 ENCOUNTER — Encounter: Payer: Self-pay | Admitting: Podiatry

## 2019-07-21 DIAGNOSIS — L603 Nail dystrophy: Secondary | ICD-10-CM | POA: Diagnosis not present

## 2019-07-21 DIAGNOSIS — Z79899 Other long term (current) drug therapy: Secondary | ICD-10-CM | POA: Diagnosis not present

## 2019-07-21 MED ORDER — FLUCONAZOLE 150 MG PO TABS: 300.0000 mg | ORAL_TABLET | ORAL | 0 refills | Status: DC

## 2019-07-21 NOTE — Progress Notes (Signed)
She presents today for follow-up of her fluconazole she has completed 3 months of fluconazole she states that she is doing just fine with it denies fever chills nausea vomiting muscle aches pains calf pain back pain chest pain shortness of breath.  Objective: Vital signs are stable alert oriented x3 toenails appear to be clearing nicely but slowly from proximal to distal.  Assessment: Slowly resolving onychomycosis.  Plan: Continue the use of fluconazole 2 tablets once weekly for the next 3 months follow-up with me at that time.

## 2019-07-22 ENCOUNTER — Encounter: Payer: Self-pay | Admitting: Podiatry

## 2019-07-22 LAB — HEPATIC FUNCTION PANEL
ALT: 27 IU/L (ref 0–32)
AST: 30 IU/L (ref 0–40)
Albumin: 4.5 g/dL (ref 3.7–4.7)
Alkaline Phosphatase: 81 IU/L (ref 39–117)
Bilirubin Total: 0.2 mg/dL (ref 0.0–1.2)
Bilirubin, Direct: 0.08 mg/dL (ref 0.00–0.40)
Total Protein: 6.4 g/dL (ref 6.0–8.5)

## 2019-07-29 ENCOUNTER — Other Ambulatory Visit: Payer: Self-pay | Admitting: Internal Medicine

## 2019-07-29 DIAGNOSIS — G629 Polyneuropathy, unspecified: Secondary | ICD-10-CM | POA: Diagnosis not present

## 2019-08-02 ENCOUNTER — Telehealth: Payer: Self-pay

## 2019-08-02 NOTE — Telephone Encounter (Signed)
-----   Message from Garrel Ridgel, [place] sent at 08/02/2019  8:53 AM EDT ----- Labs are normal - continue with medication

## 2019-08-02 NOTE — Telephone Encounter (Signed)
Patient has been notified of results via my chart

## 2019-08-03 DIAGNOSIS — N814 Uterovaginal prolapse, unspecified: Secondary | ICD-10-CM | POA: Diagnosis not present

## 2019-08-03 DIAGNOSIS — N3941 Urge incontinence: Secondary | ICD-10-CM | POA: Diagnosis not present

## 2019-08-07 ENCOUNTER — Other Ambulatory Visit: Payer: Self-pay | Admitting: Internal Medicine

## 2019-08-07 DIAGNOSIS — F17200 Nicotine dependence, unspecified, uncomplicated: Secondary | ICD-10-CM

## 2019-08-12 ENCOUNTER — Encounter: Payer: Self-pay | Admitting: Internal Medicine

## 2019-08-12 DIAGNOSIS — L720 Epidermal cyst: Secondary | ICD-10-CM | POA: Diagnosis not present

## 2019-08-28 HISTORY — PX: ABDOMINAL HYSTERECTOMY: SHX81

## 2019-08-31 DIAGNOSIS — R200 Anesthesia of skin: Secondary | ICD-10-CM | POA: Diagnosis not present

## 2019-08-31 DIAGNOSIS — M79604 Pain in right leg: Secondary | ICD-10-CM | POA: Diagnosis not present

## 2019-08-31 DIAGNOSIS — N813 Complete uterovaginal prolapse: Secondary | ICD-10-CM | POA: Diagnosis not present

## 2019-08-31 DIAGNOSIS — Z01818 Encounter for other preprocedural examination: Secondary | ICD-10-CM | POA: Diagnosis not present

## 2019-08-31 DIAGNOSIS — N8111 Cystocele, midline: Secondary | ICD-10-CM | POA: Diagnosis not present

## 2019-08-31 DIAGNOSIS — R202 Paresthesia of skin: Secondary | ICD-10-CM | POA: Diagnosis not present

## 2019-08-31 DIAGNOSIS — N8110 Cystocele, unspecified: Secondary | ICD-10-CM | POA: Diagnosis not present

## 2019-08-31 DIAGNOSIS — M5416 Radiculopathy, lumbar region: Secondary | ICD-10-CM | POA: Diagnosis not present

## 2019-08-31 DIAGNOSIS — M25551 Pain in right hip: Secondary | ICD-10-CM | POA: Diagnosis not present

## 2019-08-31 DIAGNOSIS — N3941 Urge incontinence: Secondary | ICD-10-CM | POA: Diagnosis not present

## 2019-08-31 DIAGNOSIS — G629 Polyneuropathy, unspecified: Secondary | ICD-10-CM | POA: Diagnosis not present

## 2019-08-31 DIAGNOSIS — N814 Uterovaginal prolapse, unspecified: Secondary | ICD-10-CM | POA: Diagnosis not present

## 2019-08-31 DIAGNOSIS — M79605 Pain in left leg: Secondary | ICD-10-CM | POA: Diagnosis not present

## 2019-08-31 DIAGNOSIS — G6282 Radiation-induced polyneuropathy: Secondary | ICD-10-CM | POA: Diagnosis not present

## 2019-09-01 ENCOUNTER — Other Ambulatory Visit: Payer: Self-pay | Admitting: Internal Medicine

## 2019-09-01 NOTE — Telephone Encounter (Signed)
Medication Refill - Medication:   lovastatin (MEVACOR) 40 MG tablet [id number]   Preferred Pharmacy (with phone number or street name):  [facility] [place] [facility] [address] [place] [address]  Phone: [phone] Fax: [phone]     Agent: Please be advised that RX refills may take up to 3 business days. We ask that you follow-up with your pharmacy.

## 2019-09-02 MED ORDER — LOVASTATIN 40 MG PO TABS: 40.0000 mg | ORAL_TABLET | Freq: Every day | ORAL | 1 refills | Status: DC

## 2019-09-10 DIAGNOSIS — Z20822 Contact with and (suspected) exposure to covid-19: Secondary | ICD-10-CM | POA: Diagnosis not present

## 2019-09-13 DIAGNOSIS — N858 Other specified noninflammatory disorders of uterus: Secondary | ICD-10-CM | POA: Diagnosis not present

## 2019-09-13 DIAGNOSIS — Z20822 Contact with and (suspected) exposure to covid-19: Secondary | ICD-10-CM | POA: Diagnosis not present

## 2019-09-13 DIAGNOSIS — E079 Disorder of thyroid, unspecified: Secondary | ICD-10-CM | POA: Diagnosis not present

## 2019-09-13 DIAGNOSIS — N8110 Cystocele, unspecified: Secondary | ICD-10-CM | POA: Diagnosis not present

## 2019-09-13 DIAGNOSIS — N879 Dysplasia of cervix uteri, unspecified: Secondary | ICD-10-CM | POA: Diagnosis not present

## 2019-09-13 DIAGNOSIS — N813 Complete uterovaginal prolapse: Secondary | ICD-10-CM | POA: Diagnosis not present

## 2019-09-13 DIAGNOSIS — G4730 Sleep apnea, unspecified: Secondary | ICD-10-CM | POA: Diagnosis not present

## 2019-09-13 DIAGNOSIS — D260 Other benign neoplasm of cervix uteri: Secondary | ICD-10-CM | POA: Diagnosis not present

## 2019-09-13 HISTORY — PX: TOTAL VAGINAL HYSTERECTOMY: SHX2548

## 2019-09-16 ENCOUNTER — Encounter: Payer: Self-pay | Admitting: Internal Medicine

## 2019-09-16 ENCOUNTER — Telehealth: Payer: Self-pay | Admitting: Internal Medicine

## 2019-09-24 DIAGNOSIS — Z96642 Presence of left artificial hip joint: Secondary | ICD-10-CM | POA: Diagnosis not present

## 2019-09-24 DIAGNOSIS — G8929 Other chronic pain: Secondary | ICD-10-CM | POA: Diagnosis not present

## 2019-09-24 DIAGNOSIS — M1711 Unilateral primary osteoarthritis, right knee: Secondary | ICD-10-CM | POA: Diagnosis not present

## 2019-09-24 DIAGNOSIS — M172 Bilateral post-traumatic osteoarthritis of knee: Secondary | ICD-10-CM | POA: Diagnosis not present

## 2019-09-24 DIAGNOSIS — M25551 Pain in right hip: Secondary | ICD-10-CM | POA: Diagnosis not present

## 2019-10-01 DIAGNOSIS — D0511 Intraductal carcinoma in situ of right breast: Secondary | ICD-10-CM | POA: Diagnosis not present

## 2019-10-01 DIAGNOSIS — R69 Illness, unspecified: Secondary | ICD-10-CM | POA: Diagnosis not present

## 2019-10-01 DIAGNOSIS — I10 Essential (primary) hypertension: Secondary | ICD-10-CM | POA: Diagnosis not present

## 2019-10-15 DIAGNOSIS — H04123 Dry eye syndrome of bilateral lacrimal glands: Secondary | ICD-10-CM | POA: Diagnosis not present

## 2019-10-15 DIAGNOSIS — H02834 Dermatochalasis of left upper eyelid: Secondary | ICD-10-CM | POA: Diagnosis not present

## 2019-10-15 DIAGNOSIS — H02839 Dermatochalasis of unspecified eye, unspecified eyelid: Secondary | ICD-10-CM | POA: Diagnosis not present

## 2019-10-15 DIAGNOSIS — H2513 Age-related nuclear cataract, bilateral: Secondary | ICD-10-CM | POA: Diagnosis not present

## 2019-10-27 ENCOUNTER — Encounter: Payer: Self-pay | Admitting: Podiatry

## 2019-10-27 ENCOUNTER — Other Ambulatory Visit: Payer: Self-pay

## 2019-10-27 ENCOUNTER — Ambulatory Visit (INDEPENDENT_AMBULATORY_CARE_PROVIDER_SITE_OTHER): Payer: Medicare HMO | Admitting: Podiatry

## 2019-10-27 DIAGNOSIS — Z79899 Other long term (current) drug therapy: Secondary | ICD-10-CM | POA: Diagnosis not present

## 2019-10-27 NOTE — Progress Notes (Signed)
She presents today for follow-up of her nail fungus.  She is completed 6 months of fluconazole and states that I think they are looking good.  Objective: Toenails appear to have grown out 100%.  To be doing very well.  Assessment: Well-healing onychomycosis.  Plan: Discontinue the use of fluconazole follow-up with [place] as needed.

## 2019-11-04 ENCOUNTER — Telehealth: Payer: Self-pay

## 2019-11-04 NOTE — Telephone Encounter (Signed)
Unsuccessful attempt at calling patient, no voicemail available, to notify them that it is time to schedule the low dose lung cancer screening CT scan.  

## 2019-11-05 DIAGNOSIS — I10 Essential (primary) hypertension: Secondary | ICD-10-CM | POA: Diagnosis not present

## 2019-11-05 DIAGNOSIS — R69 Illness, unspecified: Secondary | ICD-10-CM | POA: Diagnosis not present

## 2019-11-12 ENCOUNTER — Telehealth: Payer: Self-pay

## 2019-11-12 ENCOUNTER — Other Ambulatory Visit: Payer: Self-pay

## 2019-11-12 ENCOUNTER — Encounter: Payer: Self-pay | Admitting: Internal Medicine

## 2019-11-12 ENCOUNTER — Ambulatory Visit (INDEPENDENT_AMBULATORY_CARE_PROVIDER_SITE_OTHER): Payer: Medicare HMO | Admitting: Internal Medicine

## 2019-11-12 VITALS — BP 112/62 | HR 84 | Temp 98.1°F | Ht 64.0 in | Wt 147.0 lb

## 2019-11-12 DIAGNOSIS — E034 Atrophy of thyroid (acquired): Secondary | ICD-10-CM | POA: Diagnosis not present

## 2019-11-12 DIAGNOSIS — E782 Mixed hyperlipidemia: Secondary | ICD-10-CM

## 2019-11-12 DIAGNOSIS — R69 Illness, unspecified: Secondary | ICD-10-CM | POA: Diagnosis not present

## 2019-11-12 DIAGNOSIS — I10 Essential (primary) hypertension: Secondary | ICD-10-CM

## 2019-11-12 DIAGNOSIS — G6282 Radiation-induced polyneuropathy: Secondary | ICD-10-CM

## 2019-11-12 DIAGNOSIS — M8580 Other specified disorders of bone density and structure, unspecified site: Secondary | ICD-10-CM | POA: Diagnosis not present

## 2019-11-12 DIAGNOSIS — Z122 Encounter for screening for malignant neoplasm of respiratory organs: Secondary | ICD-10-CM

## 2019-11-12 DIAGNOSIS — Z1231 Encounter for screening mammogram for malignant neoplasm of breast: Secondary | ICD-10-CM

## 2019-11-12 DIAGNOSIS — Z0000 Encounter for general adult medical examination without abnormal findings: Secondary | ICD-10-CM

## 2019-11-12 DIAGNOSIS — F064 Anxiety disorder due to known physiological condition: Secondary | ICD-10-CM | POA: Diagnosis not present

## 2019-11-12 DIAGNOSIS — Z87891 Personal history of nicotine dependence: Secondary | ICD-10-CM

## 2019-11-12 LAB — POCT URINALYSIS DIPSTICK
Bilirubin, UA: NEGATIVE
Blood, UA: NEGATIVE
Glucose, UA: NEGATIVE
Ketones, UA: NEGATIVE
Leukocytes, UA: NEGATIVE
Nitrite, UA: NEGATIVE
Protein, UA: NEGATIVE
Spec Grav, UA: 1.01
Urobilinogen, UA: 0.2 U/dL
pH, UA: 7.5

## 2019-11-12 NOTE — Telephone Encounter (Signed)
Called patient to schedule her annual lung screening CT scan.  Patient agreeable and we have scheduled her for Monday, July 26 at 1:00.  She is aware of where to go for scan.  Patient has not had a COVID vaccine in the past 4 weeks.  She is a current smoker, smoking a 1/2 pack per day.  She states she is currently in a smoking cessation program.  Patient feels she has no conditions that would prevent treatment in the event of a cancer finding.

## 2019-11-12 NOTE — Progress Notes (Signed)
Date:  11/12/2019   Name:  Cheyenne Cummings   DOB:  [birth year]   MRN:  [id number]   Chief Complaint: Annual Exam (breast exam no pap)  Cheyenne Cummings is a 74 y.o. female who presents today for her Complete Annual Exam. She feels well. She reports exercising none. She reports she is sleeping well. Breast complaints none.  Mammogram: 01/2019 DEXA: 01/2018 Pap smear: discontinued due to hysterectomy in May Colonoscopy: 12/2018  Immunization History  Administered Date(s) Administered  . Influenza, High Dose Seasonal PF 02/03/2018, 12/22/2018  . Influenza,inj,Quad PF,6+ Mos 01/12/2015, 03/06/2016  . Influenza-Unspecified 02/17/2017, 02/03/2018  . PFIZER SARS-COV-2 Vaccination 06/14/2019, 07/05/2019  . Pneumococcal Conjugate-13 01/12/2015  . Pneumococcal Polysaccharide-23 04/30/2009, 10/02/2016  . Tdap 04/30/2009  . Zoster 04/30/2010  . Zoster Recombinat (Shingrix) 02/10/2018, 04/26/2018    Hypertension Pertinent negatives include no chest pain, headaches, palpitations or shortness of breath. Past treatments include angiotensin blockers. The current treatment provides significant improvement. Identifiable causes of hypertension include a thyroid problem.  Thyroid Problem Presents for follow-up visit. Patient reports no anxiety, constipation, diarrhea, fatigue, palpitations or tremors. The symptoms have been stable. Her past medical history is significant for hyperlipidemia.  Hyperlipidemia The problem is controlled. Pertinent negatives include no chest pain, myalgias or shortness of breath. Current antihyperlipidemic treatment includes statins. The current treatment provides significant improvement of lipids.    Lab Results  Component Value Date   CREATININE 0.75 02/15/2019   BUN 14 02/15/2019   NA 135 02/15/2019   K 4.9 02/15/2019   CL 99 02/15/2019   CO2 23 02/15/2019   Lab Results  Component Value Date   CHOL 156 11/11/2018   HDL 75 11/11/2018   LDLCALC 67 11/11/2018    TRIG 69 11/11/2018   CHOLHDL 2.1 11/11/2018   Lab Results  Component Value Date   TSH 0.944 02/15/2019   Lab Results  Component Value Date   HGBA1C 5.4 02/15/2019   Lab Results  Component Value Date   WBC 3.5 11/11/2018   HGB 13.7 11/11/2018   HCT 40.8 11/11/2018   MCV 93 11/11/2018   PLT 225 11/11/2018   Lab Results  Component Value Date   ALT 27 07/21/2019   AST 30 07/21/2019   ALKPHOS 81 07/21/2019   BILITOT 0.2 07/21/2019     Review of Systems  Constitutional: Negative for appetite change, fatigue, fever and unexpected weight change.  HENT: Positive for hearing loss. Negative for tinnitus and trouble swallowing.   Eyes: Negative for visual disturbance.  Respiratory: Negative for cough, chest tightness and shortness of breath.   Cardiovascular: Negative for chest pain, palpitations and leg swelling.  Gastrointestinal: Negative for abdominal pain, constipation and diarrhea.  Endocrine: Negative for polydipsia and polyuria.  Genitourinary: Negative for dysuria and hematuria.  Musculoskeletal: Positive for arthralgias (hip revision next April). Negative for joint swelling and myalgias.  Skin: Negative for color change and rash.  Neurological: Negative for tremors, numbness and headaches.  Psychiatric/Behavioral: Negative for dysphoric mood and sleep disturbance. The patient is not nervous/anxious.     Patient Active Problem List   Diagnosis Date Noted  . History of total hip replacement, left 09/08/2018  . OSA on CPAP 05/11/2018  . Osteopenia of right hip 11/06/2017  . Hypothyroidism due to acquired atrophy of thyroid 10/02/2016  . Tinnitus of right ear 05/01/2016  . Ductal carcinoma in situ (DCIS) of right breast 09/18/2015  . Tobacco use disorder, moderate, in sustained remission, dependence 09/18/2015  .  Alphaherpesviral disease 10/01/2014  . Hyperlipidemia 10/01/2014  . Anxiety disorder due to known physiological condition 10/01/2014  . Environmental and  seasonal allergies 10/01/2014  . BB block 10/01/2014  . Edema extremities 10/01/2014  . Essential (primary) hypertension 10/01/2014  . History of colon polyps 10/01/2014  . Arthritis of knee, degenerative 10/01/2014  . Addison anemia 10/01/2014  . Plantar fasciitis 02/11/2014  . Radiation-induced polyneuropathy ([place]) 02/11/2014    No Known Allergies  Past Surgical History:  Procedure Laterality Date  . ARTHROSCOPY WITH ANTERIOR CRUCIATE LIGAMENT (ACL) REPAIR WITH ANTERIOR TIBILIAS GRAFT Bilateral 1996, 2003  . BREAST BIOPSY Right 10/27/15   Benign - fat necrosis/post rad changes  . BREAST BIOPSY Right 02/25/2013   DCIS  . BREAST LUMPECTOMY Right 2014   DCIS - XRT , clear margins and negative LN  . BUNIONECTOMY Right 2015  . FOOT SURGERY Right 2018  . TONSILLECTOMY    . TOTAL HIP ARTHROPLASTY Left 2010  . TYMPANOPLASTY      Social History   Tobacco Use  . Smoking status: Current Every Day Smoker    Packs/day: 0.50    Years: 50.00    Pack years: 25.00    Types: Cigarettes    Last attempt to quit: 10/12/2018    Years since quitting: 1.0  . Smokeless tobacco: Never Used  Vaping Use  . Vaping Use: Never used  Substance Use Topics  . Alcohol use: Yes    Alcohol/week: 0.0 standard drinks    Comment: occ  . Drug use: No     Medication list has been reviewed and updated.  Current Meds  Medication Sig  . acyclovir (ZOVIRAX) 800 MG tablet 800 mg as needed.   . B Complex-C (SUPER B COMPLEX PO) Take by mouth.  . cholecalciferol (VITAMIN D) 1000 UNITS tablet Take 1,000 Units by mouth daily.  . clonazePAM (KLONOPIN) 0.5 MG tablet Take 0.5 mg by mouth daily.   . cyanocobalamin 1000 MCG tablet Take 1 tablet by mouth daily.   Marland Kitchen gabapentin (NEURONTIN) 300 MG capsule Take 300 mg by mouth 2 (two) times daily.   Marland Kitchen levothyroxine (SYNTHROID) 75 MCG tablet TAKE 1 TABLET DAILY  . loratadine (CLARITIN) 10 MG tablet Take 10 mg by mouth daily.  Marland Kitchen losartan (COZAAR) 50 MG tablet TAKE 1  TABLET DAILY  . lovastatin (MEVACOR) 40 MG tablet Take 1 tablet (40 mg total) by mouth at bedtime.  . mometasone (NASONEX) 50 MCG/ACT nasal spray Place 2 sprays into the nose daily. 50 mcg  . Multiple Vitamins-Minerals (MULTIVITAMIN WITH MINERALS) tablet Take 1 tablet by mouth daily.  . mupirocin nasal ointment (BACTROBAN) 2 % Place 1 application into the nose 2 (two) times daily. Use one-half of tube in each nostril twice daily for five (5) days. After application, press sides of nose together and gently massage.  Marland Kitchen NEOMYCIN-POLYMYXIN-HYDROCORTISONE (CORTISPORIN) 1 % SOLN OTIC solution Apply 1-2 drops to toe BID after soaking  . NON FORMULARY CPAP @@ 8 cm H2O  . venlafaxine XR (EFFEXOR-XR) 75 MG 24 hr capsule Take 1 capsule by mouth one time daily    PHQ 2/9 Scores 11/12/2019 07/06/2019 02/15/2019 11/30/2018  PHQ - 2 Score 0 0 0 0  PHQ- 9 Score 0 0 - -    GAD 7 : Generalized Anxiety Score 11/12/2019 07/06/2019  Nervous, Anxious, on Edge 1 0  Control/stop worrying 1 0  Worry too much - different things 1 0  Trouble relaxing 0 0  Restless 0 0  Easily  annoyed or irritable 0 0  Afraid - awful might happen 0 0  Total GAD 7 Score 3 0  Anxiety Difficulty Not difficult at all Not difficult at all    BP Readings from Last 3 Encounters:  11/12/19 112/62  07/06/19 120/80  02/15/19 124/78    Physical Exam Vitals and nursing note reviewed.  Constitutional:      General: She is not in acute distress.    Appearance: She is well-developed.  HENT:     Head: Normocephalic and atraumatic.     Right Ear: Tympanic membrane and ear canal normal.     Left Ear: Tympanic membrane and ear canal normal.     Nose:     Right Sinus: No maxillary sinus tenderness.     Left Sinus: No maxillary sinus tenderness.  Eyes:     General: No scleral icterus.       Right eye: No discharge.        Left eye: No discharge.     Conjunctiva/sclera: Conjunctivae normal.  Neck:     Thyroid: No thyromegaly.      Vascular: No carotid bruit.  Cardiovascular:     Rate and Rhythm: Normal rate and regular rhythm.     Pulses: Normal pulses.     Heart sounds: Normal heart sounds.  Pulmonary:     Effort: Pulmonary effort is normal. No respiratory distress.     Breath sounds: No wheezing.  Chest:     Breasts:        Right: No mass, nipple discharge, skin change or tenderness.        Left: No mass, nipple discharge, skin change or tenderness.  Abdominal:     General: Bowel sounds are normal.     Palpations: Abdomen is soft.     Tenderness: There is no abdominal tenderness.  Musculoskeletal:        General: Normal range of motion.     Cervical back: Normal range of motion. No erythema.     Right lower leg: No edema.     Left lower leg: No edema.  Lymphadenopathy:     Cervical: No cervical adenopathy.  Skin:    General: Skin is warm and dry.     Capillary Refill: Capillary refill takes less than 2 seconds.     Findings: No rash.  Neurological:     General: No focal deficit present.     Mental Status: She is alert and oriented to person, place, and time.     Cranial Nerves: No cranial nerve deficit.     Sensory: No sensory deficit.     Deep Tendon Reflexes: Reflexes are normal and symmetric.  Psychiatric:        Attention and Perception: Attention normal.        Mood and Affect: Mood normal.        Behavior: Behavior normal.     Wt Readings from Last 3 Encounters:  11/12/19 147 lb (66.7 kg)  07/06/19 151 lb (68.5 kg)  02/15/19 151 lb (68.5 kg)    BP 112/62   Pulse 84   Temp 98.1 F (36.7 C) (Oral)   Ht 5\' 4"  (1.626 m)   Wt 147 lb (66.7 kg)   SpO2 98%   BMI 25.23 kg/m   Assessment and Plan: 1. Annual physical exam Normal exam Continue healthy diet; she can not exercise due to hip and knees - POCT urinalysis dipstick  2. Encounter for screening mammogram for breast cancer Schedule at [facility] -  MM 3D SCREEN BREAST BILATERAL; Future  3. Essential (primary)  hypertension Clinically stable exam with well controlled BP. Tolerating medications without side effects at this time. Pt to continue current regimen and low sodium diet; benefits of regular exercise as able discussed. - CBC with Differential/Platelet - Comprehensive metabolic panel  4. Hypothyroidism due to acquired atrophy of thyroid supplemented - TSH + free T4  5. Mixed hyperlipidemia Tolerating statin medication without side effects at this time Continue same therapy without change at this time. - Lipid panel  6. Anxiety disorder due to known physiological condition Some increase in worry over husbands cognitive impairment, no SI/HI. Continue Effexor 75 mg daily  7. Osteopenia determined by x-ray - DG Bone Density; Future  8. 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 Any errors are unintentional.  Halina Maidens, MD [facility]  11/12/2019

## 2019-11-13 LAB — CBC WITH DIFFERENTIAL/PLATELET
Basophils Absolute: 0 10*3/uL (ref 0.0–0.2)
Basos: 1 %
EOS (ABSOLUTE): 0.1 10*3/uL (ref 0.0–0.4)
Eos: 2 %
Hematocrit: 38.5 % (ref 34.0–46.6)
Hemoglobin: 13 g/dL (ref 11.1–15.9)
Immature Grans (Abs): 0 10*3/uL (ref 0.0–0.1)
Immature Granulocytes: 0 %
Lymphocytes Absolute: 0.9 10*3/uL (ref 0.7–3.1)
Lymphs: 17 %
MCH: 31.5 pg (ref 26.6–33.0)
MCHC: 33.8 g/dL (ref 31.5–35.7)
MCV: 93 fL (ref 79–97)
Monocytes Absolute: 0.4 10*3/uL (ref 0.1–0.9)
Monocytes: 7 %
Neutrophils Absolute: 4 10*3/uL (ref 1.4–7.0)
Neutrophils: 73 %
Platelets: 247 10*3/uL (ref 150–450)
RBC: 4.13 x10E6/uL (ref 3.77–5.28)
RDW: 13.1 % (ref 11.7–15.4)
WBC: 5.5 10*3/uL (ref 3.4–10.8)

## 2019-11-13 LAB — COMPREHENSIVE METABOLIC PANEL
ALT: 20 IU/L (ref 0–32)
AST: 25 IU/L (ref 0–40)
Albumin/Globulin Ratio: 2.2 (ref 1.2–2.2)
Albumin: 4.4 g/dL (ref 3.7–4.7)
Alkaline Phosphatase: 86 IU/L (ref 48–121)
BUN/Creatinine Ratio: 17 (ref 12–28)
BUN: 12 mg/dL (ref 8–27)
Bilirubin Total: 0.2 mg/dL (ref 0.0–1.2)
CO2: 24 mmol/L (ref 20–29)
Calcium: 9.5 mg/dL (ref 8.7–10.3)
Chloride: 98 mmol/L (ref 96–106)
Creatinine, Ser: 0.7 mg/dL (ref 0.57–1.00)
GFR calc Af Amer: 99 mL/min/{1.73_m2} (ref >59)
GFR calc non Af Amer: 86 mL/min/{1.73_m2} (ref >59)
Globulin, Total: 2 g/dL (ref 1.5–4.5)
Glucose: 80 mg/dL (ref 65–99)
Potassium: 4.8 mmol/L (ref 3.5–5.2)
Sodium: 134 mmol/L (ref 134–144)
Total Protein: 6.4 g/dL (ref 6.0–8.5)

## 2019-11-13 LAB — LIPID PANEL
Chol/HDL Ratio: 2.4 ratio (ref 0.0–4.4)
Cholesterol, Total: 172 mg/dL (ref 100–199)
HDL: 73 mg/dL (ref >39)
LDL Chol Calc (NIH): 81 mg/dL (ref 0–99)
Triglycerides: 100 mg/dL (ref 0–149)
VLDL Cholesterol Cal: 18 mg/dL (ref 5–40)

## 2019-11-13 LAB — TSH+FREE T4
Free T4: 1.42 ng/dL (ref 0.82–1.77)
TSH: 1 u[IU]/mL (ref 0.450–4.500)

## 2019-11-15 NOTE — Addendum Note (Signed)
Addended by: Lieutenant Diego on: 11/15/2019 01:44 PM   Modules accepted: Orders

## 2019-11-15 NOTE — Telephone Encounter (Signed)
Smoking history: current, 39.5 pack year

## 2019-11-19 ENCOUNTER — Encounter: Payer: Self-pay | Admitting: Internal Medicine

## 2019-11-22 ENCOUNTER — Ambulatory Visit
Admission: RE | Admit: 2019-11-22 | Discharge: 2019-11-22 | Disposition: A | Discharge status: Home / Self Care | Payer: Medicare HMO | Source: Ambulatory Visit | Attending: Oncology | Admitting: Oncology

## 2019-11-22 ENCOUNTER — Other Ambulatory Visit: Payer: Self-pay

## 2019-11-22 DIAGNOSIS — Z87891 Personal history of nicotine dependence: Secondary | ICD-10-CM | POA: Diagnosis not present

## 2019-11-22 DIAGNOSIS — Z122 Encounter for screening for malignant neoplasm of respiratory organs: Secondary | ICD-10-CM | POA: Diagnosis not present

## 2019-11-22 DIAGNOSIS — R69 Illness, unspecified: Secondary | ICD-10-CM | POA: Diagnosis not present

## 2019-11-24 ENCOUNTER — Encounter: Payer: Self-pay | Admitting: *Deleted

## 2019-11-24 ENCOUNTER — Encounter: Payer: Self-pay | Admitting: Internal Medicine

## 2019-11-24 DIAGNOSIS — I700 Atherosclerosis of aorta: Secondary | ICD-10-CM | POA: Insufficient documentation

## 2019-11-25 DIAGNOSIS — G4733 Obstructive sleep apnea (adult) (pediatric): Secondary | ICD-10-CM | POA: Diagnosis not present

## 2019-11-25 DIAGNOSIS — I10 Essential (primary) hypertension: Secondary | ICD-10-CM | POA: Diagnosis not present

## 2019-11-25 DIAGNOSIS — R69 Illness, unspecified: Secondary | ICD-10-CM | POA: Diagnosis not present

## 2019-11-25 DIAGNOSIS — E782 Mixed hyperlipidemia: Secondary | ICD-10-CM | POA: Diagnosis not present

## 2019-11-25 DIAGNOSIS — I454 Nonspecific intraventricular block: Secondary | ICD-10-CM | POA: Diagnosis not present

## 2019-11-25 DIAGNOSIS — Z9989 Dependence on other enabling machines and devices: Secondary | ICD-10-CM | POA: Diagnosis not present

## 2019-12-01 ENCOUNTER — Ambulatory Visit (INDEPENDENT_AMBULATORY_CARE_PROVIDER_SITE_OTHER): Payer: Medicare HMO

## 2019-12-01 DIAGNOSIS — Z0000 Encounter for general adult medical examination without abnormal findings: Secondary | ICD-10-CM | POA: Diagnosis not present

## 2019-12-01 NOTE — Progress Notes (Signed)
Subjective:   Cheyenne Cummings is a 74 y.o. female who presents for Medicare Annual (Subsequent) preventive examination.  Virtual Visit via Telephone Note  I connected with  Cheyenne Cummings on 12/01/19 at 10:00 AM EDT by telephone and verified that I am speaking with the correct person using two identifiers.  Medicare Annual Wellness visit completed telephonically due to Covid-19 pandemic.   Location: Patient: home Provider: office   I discussed the limitations, risks, security and privacy concerns of performing an evaluation and management service by telephone and the availability of in person appointments. The patient expressed understanding and agreed to proceed.  Unable to perform video visit due to video visit attempted and failed and/or patient does not have video capability.   Some vital signs may be absent or patient reported.   Clemetine Marker, LPN    Review of Systems     Cardiac Risk Factors include: advanced age (>56men, >51 women);dyslipidemia;hypertension;smoking/ tobacco exposure     Objective:    Today's Vitals   12/01/19 1006  PainSc: 6    There is no height or weight on file to calculate BMI.  Advanced Directives 12/01/2019 11/30/2018 11/24/2017 11/07/2017 10/02/2016 11/03/2015 09/18/2015  Does Patient Have a Medical Advance Directive? Yes Yes Yes Yes Yes No Yes  Type of Paramedic of [place];Living will [place];Living will [place];Living will Living will;Healthcare Power of Attorney Living will - [place]  Does patient want to make changes to medical advance directive? - - - - - - -  Copy of [place] in Chart? Yes - validated most recent copy scanned in chart (See row information) No - copy requested Yes No - copy requested - - -  Would patient like information on creating a medical advance directive? - - - - - No - patient declined information -    Current  Medications (verified) Outpatient Encounter Medications as of 12/01/2019  Medication Sig  . acyclovir (ZOVIRAX) 800 MG tablet 800 mg as needed.   . B Complex-C (SUPER B COMPLEX PO) Take by mouth.  . cholecalciferol (VITAMIN D) 1000 UNITS tablet Take 1,000 Units by mouth daily.  . clonazePAM (KLONOPIN) 0.5 MG tablet Take 0.5 mg by mouth daily.   Marland Kitchen gabapentin (NEURONTIN) 300 MG capsule Take 300 mg by mouth 2 (two) times daily.   Marland Kitchen levothyroxine (SYNTHROID) 75 MCG tablet TAKE 1 TABLET DAILY  . loratadine (CLARITIN) 10 MG tablet Take 10 mg by mouth daily.  Marland Kitchen losartan (COZAAR) 50 MG tablet TAKE 1 TABLET DAILY  . lovastatin (MEVACOR) 40 MG tablet Take 1 tablet (40 mg total) by mouth at bedtime.  . mometasone (NASONEX) 50 MCG/ACT nasal spray Place 2 sprays into the nose daily. 50 mcg  . Multiple Vitamins-Minerals (MULTIVITAMIN WITH MINERALS) tablet Take 1 tablet by mouth daily.  . NON FORMULARY CPAP @@ 8 cm H2O  . venlafaxine XR (EFFEXOR-XR) 75 MG 24 hr capsule Take 1 capsule by mouth one time daily  . [DISCONTINUED] cyanocobalamin 1000 MCG tablet Take 1 tablet by mouth daily.   . [DISCONTINUED] Fluoxetine HCl, PMDD, 10 MG TABS fluoxetine 10 mg tablet  . [DISCONTINUED] mupirocin nasal ointment (BACTROBAN) 2 % Place 1 application into the nose 2 (two) times daily. Use one-half of tube in each nostril twice daily for five (5) days. After application, press sides of nose together and gently massage.  . [DISCONTINUED] NEOMYCIN-POLYMYXIN-HYDROCORTISONE (CORTISPORIN) 1 % SOLN OTIC solution Apply  1-2 drops to toe BID after soaking   No facility-administered encounter medications on file as of 12/01/2019.    Allergies (verified) Patient has no known allergies.   History: Past Medical History:  Diagnosis Date  . Addison anemia   . Anxiety disorder   . Breast cancer ([place]) 01/27/2013   right DCIS lumpectomy, radiation   . Ectatic thoracic aorta ([place]) 11/09/2018   Followed yearly with CT  . Edema  extremities   . Family history of aneurysm 10/06/2014   father died from aortic aneurysm   . Herpes simplex   . Hyperlipidemia   . Hypertension   . Hypothyroidism   . Leg pain 02/11/2014  . Neuromuscular disorder ([place])   . Osteoarthritis   . Personal history of radiation therapy   . Personal history of tobacco use, presenting hazards to health 10/06/14  . Sleep apnea    Past Surgical History:  Procedure Laterality Date  . ABDOMINAL HYSTERECTOMY  May 2021   Total vaginal hysterectomy- completley healed and dismissed  . APPENDECTOMY    . ARTHROSCOPY WITH ANTERIOR CRUCIATE LIGAMENT (ACL) REPAIR WITH ANTERIOR TIBILIAS GRAFT Bilateral 1996, 2003  . BREAST BIOPSY Right 10/27/15   Benign - fat necrosis/post rad changes  . BREAST BIOPSY Right 02/25/2013   DCIS  . BREAST LUMPECTOMY Right 2014   DCIS - XRT , clear margins and negative LN  . BUNIONECTOMY Right 2015  . FOOT SURGERY Right 2018  . JOINT REPLACEMENT    . TONSILLECTOMY    . TOTAL HIP ARTHROPLASTY Left 2010  . TOTAL VAGINAL HYSTERECTOMY  09/13/2019  . TUBAL LIGATION    . TYMPANOPLASTY     Family History  Problem Relation Age of Onset  . Stroke Mother   . Hypertension Mother   . Kidney disease Mother   . Anxiety disorder Mother   . Arthritis Mother   . Depression Mother   . Hearing loss Mother   . Varicose Veins Mother   . Hypertension Father   . Aneurysm Father   . Hearing loss Father   . Cancer Maternal Grandmother   . Breast cancer Other 2       niece  . Anxiety disorder Sister   . Arthritis Sister   . COPD Sister   . Depression Sister   . Heart disease Sister   . Hypertension Sister   . Varicose Veins Sister    Social History   Socioeconomic History  . Marital status: Married    Spouse name: Not on file  . Number of children: 2  . Years of education: Not on file  . Highest education level: Master's degree (e.g., MA, MS, MEng, MEd, MSW, MBA)  Occupational History  . Occupation: Retired  Tobacco Use   . Smoking status: Light Tobacco Smoker    Packs/day: 1.00    Years: 50.00    Pack years: 50.00    Types: Cigarettes    Last attempt to quit: 08/28/2018    Years since quitting: 1.2  . Smokeless tobacco: Never Used  . Tobacco comment: currently enrolled in smoking cessation classes  Vaping Use  . Vaping Use: Never used  Substance and Sexual Activity  . Alcohol use: Not Currently    Alcohol/week: 0.0 standard drinks    Comment: occ  . Drug use: No  . Sexual activity: Not Currently  Other Topics Concern  . Not on file  Social History Narrative  . Not on file   Social Determinants of Health   Financial  Resource Strain: Low Risk   . Difficulty of Paying Living Expenses: Not hard at all  Food Insecurity: No Food Insecurity  . Worried About Charity fundraiser in the Last Year: Never true  . Ran Out of Food in the Last Year: Never true  Transportation Needs: No Transportation Needs  . Lack of Transportation (Medical): No  . Lack of Transportation (Non-Medical): No  Physical Activity: Inactive  . Days of Exercise per Week: 0 days  . Minutes of Exercise per Session: 0 min  Stress: No Stress Concern Present  . Feeling of Stress : Not at all  Social Connections: Moderately Integrated  . Frequency of Communication with Friends and Family: More than three times a week  . Frequency of Social Gatherings with Friends and Family: Three times a week  . Attends Religious Services: More than 4 times per year  . Active Member of Clubs or Organizations: No  . Attends Archivist Meetings: Never  . Marital Status: Married    Tobacco Counseling Ready to quit: Yes Counseling given: Not Answered Comment: currently enrolled in smoking cessation classes   Clinical Intake:  Pre-visit preparation completed: No  Pain : 0-10 Pain Score: 6  Pain Type: Chronic pain Pain Location: Hip Pain Orientation: Left Pain Descriptors / Indicators: Aching, Sore Pain Onset: More than a month  ago Pain Frequency: Constant     Nutritional Risks: None Diabetes: No  How often do you need to have someone help you when you read instructions, pamphlets, or other written materials from your doctor or pharmacy?: 1 - Never    Interpreter Needed?: No  Information entered by :: Clemetine Marker LPN   Activities of Daily Living In your present state of health, do you have any difficulty performing the following activities: 12/01/2019  Hearing? Y  Comment wears hearing aids  Vision? N  Difficulty concentrating or making decisions? N  Walking or climbing stairs? N  Dressing or bathing? N  Doing errands, shopping? N  Preparing Food and eating ? N  Using the Toilet? N  In the past six months, have you accidently leaked urine? N  Do you have problems with loss of bowel control? N  Managing your Medications? N  Managing your Finances? N  Housekeeping or managing your Housekeeping? N  Some recent data might be hidden    Patient Care Team: Glean Hess, MD as PCP - General (Internal Medicine) Hobson-Webb, Preston Fleeting, MD as Referring Physician (Neurology) Waynetta Pean, MD as Consulting Physician (Surgery) Nila Nephew, MD as Consulting Physician (Orthopedic Surgery) Tendler, Rayvon Char (Inactive) as Referring Physician  Indicate any recent Medical Services you may have received from other than [facility] providers in the past year (date may be approximate).     Assessment:   This is a routine wellness examination for [place].  Hearing/Vision screen  Hearing Screening   125Hz  250Hz  500Hz  1000Hz  2000Hz  3000Hz  4000Hz  6000Hz  8000Hz   Right ear:           Left ear:           Comments: Pt wears hearing aids maintained by [employer]  Vision Screening Comments: Annual vision screenings at [place] issues and exercise activities discussed: Current Exercise Habits: The patient does not participate in regular exercise at present, Exercise limited by: orthopedic  condition(s)  Goals    . DIET - INCREASE WATER INTAKE     Recommend to drink at least 6-8 8oz glasses of water per  day.    . Increase physical activity     Pt would like to increase physical activity to at least 3 days per week    . Quit Smoking     Patient would like to completely stop smoking; currently enrolled in smoking cessation classes 12/01/19      Depression Screen PHQ 2/9 Scores 12/01/2019 11/12/2019 07/06/2019 02/15/2019 11/30/2018 11/11/2018 05/11/2018  PHQ - 2 Score 0 0 0 0 0 0 0  PHQ- 9 Score - 0 0 - - 0 -    Fall Risk Fall Risk  12/01/2019 11/12/2019 11/30/2018 11/11/2018 05/11/2018  Falls in the past year? 1 1 1 1  0  Number falls in past yr: 0 0 1 1 0  Injury with Fall? 0 0 0 0 0  Risk for fall due to : History of fall(s) - History of fall(s);Impaired balance/gait History of fall(s);Impaired balance/gait -  Risk for fall due to: Comment - - - - -  Follow up Falls prevention discussed Falls evaluation completed Falls prevention discussed Falls evaluation completed;Falls prevention discussed Falls evaluation completed    Any stairs in or around the home? Yes  If so, are there any without handrails? Yes  - outside steps on front porch  Home free of loose throw rugs in walkways, pet beds, electrical cords, etc? Yes  Adequate lighting in your home to reduce risk of falls? Yes   ASSISTIVE DEVICES UTILIZED TO PREVENT FALLS:  Life alert? No  Use of a cane, walker or w/c? No  Grab bars in the bathroom? Yes  Shower chair or bench in shower? Yes  Elevated toilet seat or a handicapped toilet? No   TIMED UP AND GO:  Was the test performed? No . Telephonic visit.   Cognitive Function:     6CIT Screen 11/30/2018 11/24/2017 10/02/2016  What Year? 0 points 0 points 0 points  What month? 0 points 0 points 0 points  What time? 0 points 0 points 0 points  Count back from 20 0 points 0 points 0 points  Months in reverse 0 points 0 points 0 points  Repeat phrase 0 points 0 points 0 points   Total Score 0 0 0    Immunizations Immunization History  Administered Date(s) Administered  . Influenza, High Dose Seasonal PF 02/03/2018, 12/22/2018  . Influenza,inj,Quad PF,6+ Mos 01/12/2015, 03/06/2016  . Influenza-Unspecified 02/17/2017, 02/03/2018  . PFIZER SARS-COV-2 Vaccination 06/14/2019, 07/05/2019  . Pneumococcal Conjugate-13 01/12/2015  . Pneumococcal Polysaccharide-23 04/30/2009, 10/02/2016  . Tdap 04/30/2009  . Zoster 04/30/2010  . Zoster Recombinat (Shingrix) 02/10/2018, 04/26/2018    TDAP status: Due, Education has been provided regarding the importance of this vaccine. Advised may receive this vaccine at local pharmacy or Health Dept. Aware to provide a copy of the vaccination record if obtained from local pharmacy or Health Dept. Verbalized acceptance and understanding.   Flu Vaccine status: Up to date   Pneumococcal vaccine status: Up to date   Covid-19 vaccine status: Completed vaccines  Qualifies for Shingles Vaccine? Yes   Zostavax completed Yes   Shingrix Completed?: Yes  Screening Tests Health Maintenance  Topic Date Due  . INFLUENZA VACCINE  11/28/2019  . TETANUS/TDAP  11/11/2020 (Originally 05/01/2019)  . MAMMOGRAM  02/11/2020  . COLONOSCOPY  02/22/2024  . DEXA SCAN  Completed  . COVID-19 Vaccine  Completed  . PNA vac Low Risk Adult  Completed  . Hepatitis C Screening  Discontinued    Health Maintenance  Health Maintenance Due  Topic Date  Due  . INFLUENZA VACCINE  11/28/2019    Colorectal cancer screening: Completed 02/22/19. Repeat every 5 years   Mammogram status: Completed 02/11/19. Repeat every year scheduled for 02/14/20  Bone Density status: Completed 02/09/18. Results reflect: Bone density results: OSTEOPENIA. Repeat every 2 years. scheduled for 02/14/20  Lung Cancer Screening: (Low Dose CT Chest recommended if Age 23-80 years, 30 pack-year currently smoking OR have quit w/in 15years.) does qualify. Completed  11/23/19.  Additional Screening:  Hepatitis C Screening: does qualify; postponed  Vision Screening: Recommended annual ophthalmology exams for early detection of glaucoma and other disorders of the eye. Is the patient up to date with their annual eye exam?  Yes  Who is the provider or what is the name of the office in which the patient attends annual eye exams? Dr. Ellin Mayhew  Dental Screening: Recommended annual dental exams for proper oral hygiene  Community Resource Referral / Chronic Care Management: CRR required this visit?  No   CCM required this visit?  No      Plan:     I have personally reviewed and noted the following in the patient's chart:   . Medical and social history . Use of alcohol, tobacco or illicit drugs  . Current medications and supplements . Functional ability and status . Nutritional status . Physical activity . Advanced directives . List of other physicians . Hospitalizations, surgeries, and ER visits in previous 12 months . Vitals . Screenings to include cognitive, depression, and falls . Referrals and appointments  In addition, I have reviewed and discussed with patient certain preventive protocols, quality metrics, and best practice recommendations. A written personalized care plan for preventive services as well as general preventive health recommendations were provided to patient.     Clemetine Marker, LPN   [id number]/[id number]   Nurse Notes: pt doing well and appreciative of visit today

## 2019-12-01 NOTE — Patient Instructions (Signed)
Ms. Aydin , Thank you for taking time to come for your Medicare Wellness Visit. I appreciate your ongoing commitment to your health goals. Please review the following plan we discussed and let me know if I can assist you in the future.   Screening recommendations/referrals: Colonoscopy: done 02/22/19 Mammogram: done 02/11/19 Bone Density: done 02/09/18 Recommended yearly ophthalmology/optometry visit for glaucoma screening and checkup Recommended yearly dental visit for hygiene and checkup  Vaccinations: Influenza vaccine: done 12/22/18 Pneumococcal vaccine: done 10/02/16 Tdap vaccine: DUE Shingles vaccine: done 02/10/18 & 04/26/18   Covid-19: done 06/14/19 & 07/05/19  Conditions/risks identified: Recommend increasing physical activity   Next appointment: Follow up in one year for your annual wellness visit    Preventive Care 3 Years and Older, Female Preventive care refers to lifestyle choices and visits with your health care provider that can promote health and wellness. What does preventive care include?  A yearly physical exam. This is also called an annual well check.  Dental exams once or twice a year.  Routine eye exams. Ask your health care provider how often you should have your eyes checked.  Personal lifestyle choices, including:  Daily care of your teeth and gums.  Regular physical activity.  Eating a healthy diet.  Avoiding tobacco and drug use.  Limiting alcohol use.  Practicing safe sex.  Taking low-dose aspirin every day.  Taking vitamin and mineral supplements as recommended by your health care provider. What happens during an annual well check? The services and screenings done by your health care provider during your annual well check will depend on your age, overall health, lifestyle risk factors, and family history of disease. Counseling  Your health care provider may ask you questions about your:  Alcohol use.  Tobacco use.  Drug  use.  Emotional well-being.  Home and relationship well-being.  Sexual activity.  Eating habits.  History of falls.  Memory and ability to understand (cognition).  Work and work Statistician.  Reproductive health. Screening  You may have the following tests or measurements:  Height, weight, and BMI.  Blood pressure.  Lipid and cholesterol levels. These may be checked every 5 years, or more frequently if you are over 74 years old.  Skin check.  Lung cancer screening. You may have this screening every year starting at age 69 if you have a 30-pack-year history of smoking and currently smoke or have quit within the past 15 years.  Fecal occult blood test (FOBT) of the stool. You may have this test every year starting at age 46.  Flexible sigmoidoscopy or colonoscopy. You may have a sigmoidoscopy every 5 years or a colonoscopy every 10 years starting at age 52.  Hepatitis C blood test.  Hepatitis B blood test.  Sexually transmitted disease (STD) testing.  Diabetes screening. This is done by checking your blood sugar (glucose) after you have not eaten for a while (fasting). You may have this done every 1-3 years.  Bone density scan. This is done to screen for osteoporosis. You may have this done starting at age 38.  Mammogram. This may be done every 1-2 years. Talk to your health care provider about how often you should have regular mammograms. Talk with your health care provider about your test results, treatment options, and if necessary, the need for more tests. Vaccines  Your health care provider may recommend certain vaccines, such as:  Influenza vaccine. This is recommended every year.  Tetanus, diphtheria, and acellular pertussis (Tdap, Td) vaccine. You may need  a Td booster every 10 years.  Zoster vaccine. You may need this after age 5.  Pneumococcal 13-valent conjugate (PCV13) vaccine. One dose is recommended after age 5.  Pneumococcal polysaccharide  (PPSV23) vaccine. One dose is recommended after age 81. Talk to your health care provider about which screenings and vaccines you need and how often you need them. This information is not intended to replace advice given to you by your health care provider. Make sure you discuss any questions you have with your health care provider. Document Released: 05/12/2015 Document Revised: 01/03/2016 Document Reviewed: 02/14/2015 Elsevier Interactive Patient Education  2017 [employer] Prevention in the Home Falls can cause injuries. They can happen to people of all ages. There are many things you can do to make your home safe and to help prevent falls. What can I do on the outside of my home?  Regularly fix the edges of walkways and driveways and fix any cracks.  Remove anything that might make you trip as you walk through a door, such as a raised step or threshold.  Trim any bushes or trees on the path to your home.  Use bright outdoor lighting.  Clear any walking paths of anything that might make someone trip, such as rocks or tools.  Regularly check to see if handrails are loose or broken. Make sure that both sides of any steps have handrails.  Any raised decks and porches should have guardrails on the edges.  Have any leaves, snow, or ice cleared regularly.  Use sand or salt on walking paths during winter.  Clean up any spills in your garage right away. This includes oil or grease spills. What can I do in the bathroom?  Use night lights.  Install grab bars by the toilet and in the tub and shower. Do not use towel bars as grab bars.  Use non-skid mats or decals in the tub or shower.  If you need to sit down in the shower, use a plastic, non-slip stool.  Keep the floor dry. Clean up any water that spills on the floor as soon as it happens.  Remove soap buildup in the tub or shower regularly.  Attach bath mats securely with double-sided non-slip rug tape.  Do not have  throw rugs and other things on the floor that can make you trip. What can I do in the bedroom?  Use night lights.  Make sure that you have a light by your bed that is easy to reach.  Do not use any sheets or blankets that are too big for your bed. They should not hang down onto the floor.  Have a firm chair that has side arms. You can use this for support while you get dressed.  Do not have throw rugs and other things on the floor that can make you trip. What can I do in the kitchen?  Clean up any spills right away.  Avoid walking on wet floors.  Keep items that you use a lot in easy-to-reach places.  If you need to reach something above you, use a strong step stool that has a grab bar.  Keep electrical cords out of the way.  Do not use floor polish or wax that makes floors slippery. If you must use wax, use non-skid floor wax.  Do not have throw rugs and other things on the floor that can make you trip. What can I do with my stairs?  Do not leave any items on the  stairs.  Make sure that there are handrails on both sides of the stairs and use them. Fix handrails that are broken or loose. Make sure that handrails are as long as the stairways.  Check any carpeting to make sure that it is firmly attached to the stairs. Fix any carpet that is loose or worn.  Avoid having throw rugs at the top or bottom of the stairs. If you do have throw rugs, attach them to the floor with carpet tape.  Make sure that you have a light switch at the top of the stairs and the bottom of the stairs. If you do not have them, ask someone to add them for you. What else can I do to help prevent falls?  Wear shoes that:  Do not have high heels.  Have rubber bottoms.  Are comfortable and fit you well.  Are closed at the toe. Do not wear sandals.  If you use a stepladder:  Make sure that it is fully opened. Do not climb a closed stepladder.  Make sure that both sides of the stepladder are  locked into place.  Ask someone to hold it for you, if possible.  Clearly mark and make sure that you can see:  Any grab bars or handrails.  First and last steps.  Where the edge of each step is.  Use tools that help you move around (mobility aids) if they are needed. These include:  Canes.  Walkers.  Scooters.  Crutches.  Turn on the lights when you go into a dark area. Replace any light bulbs as soon as they burn out.  Set up your furniture so you have a clear path. Avoid moving your furniture around.  If any of your floors are uneven, fix them.  If there are any pets around you, be aware of where they are.  Review your medicines with your doctor. Some medicines can make you feel dizzy. This can increase your chance of falling. Ask your doctor what other things that you can do to help prevent falls. This information is not intended to replace advice given to you by your health care provider. Make sure you discuss any questions you have with your health care provider. Document Released: 02/09/2009 Document Revised: 09/21/2015 Document Reviewed: 05/20/2014 Elsevier Interactive Patient Education  2017 [employer].

## 2019-12-02 DIAGNOSIS — G4733 Obstructive sleep apnea (adult) (pediatric): Secondary | ICD-10-CM | POA: Diagnosis not present

## 2019-12-02 DIAGNOSIS — R69 Illness, unspecified: Secondary | ICD-10-CM | POA: Diagnosis not present

## 2019-12-02 DIAGNOSIS — I10 Essential (primary) hypertension: Secondary | ICD-10-CM | POA: Diagnosis not present

## 2019-12-02 DIAGNOSIS — I119 Hypertensive heart disease without heart failure: Secondary | ICD-10-CM | POA: Diagnosis not present

## 2019-12-02 DIAGNOSIS — E782 Mixed hyperlipidemia: Secondary | ICD-10-CM | POA: Diagnosis not present

## 2019-12-02 DIAGNOSIS — I454 Nonspecific intraventricular block: Secondary | ICD-10-CM | POA: Diagnosis not present

## 2019-12-02 DIAGNOSIS — Z9989 Dependence on other enabling machines and devices: Secondary | ICD-10-CM | POA: Diagnosis not present

## 2019-12-17 DIAGNOSIS — I10 Essential (primary) hypertension: Secondary | ICD-10-CM | POA: Diagnosis not present

## 2019-12-17 DIAGNOSIS — R69 Illness, unspecified: Secondary | ICD-10-CM | POA: Diagnosis not present

## 2019-12-17 DIAGNOSIS — Z716 Tobacco abuse counseling: Secondary | ICD-10-CM | POA: Diagnosis not present

## 2019-12-21 DIAGNOSIS — G4733 Obstructive sleep apnea (adult) (pediatric): Secondary | ICD-10-CM | POA: Diagnosis not present

## 2019-12-27 DIAGNOSIS — R69 Illness, unspecified: Secondary | ICD-10-CM | POA: Diagnosis not present

## 2019-12-30 ENCOUNTER — Encounter: Payer: Self-pay | Admitting: Internal Medicine

## 2020-01-04 ENCOUNTER — Ambulatory Visit (INDEPENDENT_AMBULATORY_CARE_PROVIDER_SITE_OTHER): Payer: Medicare HMO

## 2020-01-04 ENCOUNTER — Other Ambulatory Visit: Payer: Self-pay

## 2020-01-04 DIAGNOSIS — Z23 Encounter for immunization: Secondary | ICD-10-CM | POA: Diagnosis not present

## 2020-01-14 DIAGNOSIS — R69 Illness, unspecified: Secondary | ICD-10-CM | POA: Diagnosis not present

## 2020-01-29 DIAGNOSIS — H903 Sensorineural hearing loss, bilateral: Secondary | ICD-10-CM | POA: Diagnosis not present

## 2020-02-14 ENCOUNTER — Other Ambulatory Visit: Payer: Medicare HMO

## 2020-02-14 ENCOUNTER — Ambulatory Visit: Payer: Medicare HMO

## 2020-02-16 ENCOUNTER — Encounter: Payer: Self-pay | Admitting: Internal Medicine

## 2020-02-22 ENCOUNTER — Other Ambulatory Visit: Payer: Self-pay

## 2020-02-22 ENCOUNTER — Ambulatory Visit
Admission: RE | Admit: 2020-02-22 | Discharge: 2020-02-22 | Disposition: A | Discharge status: Home / Self Care | Payer: Medicare HMO | Source: Ambulatory Visit | Attending: Internal Medicine | Admitting: Internal Medicine

## 2020-02-22 DIAGNOSIS — M8580 Other specified disorders of bone density and structure, unspecified site: Secondary | ICD-10-CM | POA: Diagnosis not present

## 2020-02-22 DIAGNOSIS — E039 Hypothyroidism, unspecified: Secondary | ICD-10-CM | POA: Insufficient documentation

## 2020-02-22 DIAGNOSIS — Z853 Personal history of malignant neoplasm of breast: Secondary | ICD-10-CM | POA: Diagnosis not present

## 2020-02-22 DIAGNOSIS — Z1231 Encounter for screening mammogram for malignant neoplasm of breast: Secondary | ICD-10-CM | POA: Insufficient documentation

## 2020-02-22 DIAGNOSIS — Z96642 Presence of left artificial hip joint: Secondary | ICD-10-CM | POA: Insufficient documentation

## 2020-02-22 DIAGNOSIS — Z780 Asymptomatic menopausal state: Secondary | ICD-10-CM | POA: Insufficient documentation

## 2020-02-22 DIAGNOSIS — Z13820 Encounter for screening for osteoporosis: Secondary | ICD-10-CM | POA: Insufficient documentation

## 2020-02-22 DIAGNOSIS — M85851 Other specified disorders of bone density and structure, right thigh: Secondary | ICD-10-CM | POA: Diagnosis not present

## 2020-02-22 HISTORY — DX: Nicotine dependence, unspecified, in remission: F17.201

## 2020-02-23 ENCOUNTER — Ambulatory Visit: Payer: Medicare HMO | Admitting: Internal Medicine

## 2020-02-25 DIAGNOSIS — Z716 Tobacco abuse counseling: Secondary | ICD-10-CM | POA: Diagnosis not present

## 2020-02-25 DIAGNOSIS — H02834 Dermatochalasis of left upper eyelid: Secondary | ICD-10-CM | POA: Diagnosis not present

## 2020-02-25 DIAGNOSIS — I10 Essential (primary) hypertension: Secondary | ICD-10-CM | POA: Diagnosis not present

## 2020-02-25 DIAGNOSIS — D0511 Intraductal carcinoma in situ of right breast: Secondary | ICD-10-CM | POA: Diagnosis not present

## 2020-02-25 DIAGNOSIS — Z79899 Other long term (current) drug therapy: Secondary | ICD-10-CM | POA: Diagnosis not present

## 2020-02-25 DIAGNOSIS — Z853 Personal history of malignant neoplasm of breast: Secondary | ICD-10-CM | POA: Diagnosis not present

## 2020-02-25 DIAGNOSIS — H02839 Dermatochalasis of unspecified eye, unspecified eyelid: Secondary | ICD-10-CM | POA: Diagnosis not present

## 2020-02-25 DIAGNOSIS — R69 Illness, unspecified: Secondary | ICD-10-CM | POA: Diagnosis not present

## 2020-02-25 DIAGNOSIS — H2513 Age-related nuclear cataract, bilateral: Secondary | ICD-10-CM | POA: Diagnosis not present

## 2020-02-25 DIAGNOSIS — H04123 Dry eye syndrome of bilateral lacrimal glands: Secondary | ICD-10-CM | POA: Diagnosis not present

## 2020-02-27 ENCOUNTER — Other Ambulatory Visit: Payer: Self-pay | Admitting: Internal Medicine

## 2020-02-29 ENCOUNTER — Ambulatory Visit (INDEPENDENT_AMBULATORY_CARE_PROVIDER_SITE_OTHER): Payer: Medicare HMO | Admitting: Internal Medicine

## 2020-02-29 ENCOUNTER — Other Ambulatory Visit: Payer: Self-pay

## 2020-02-29 ENCOUNTER — Encounter: Payer: Self-pay | Admitting: Internal Medicine

## 2020-02-29 VITALS — BP 124/78 | HR 74 | Temp 98.0°F | Ht 64.0 in | Wt 151.0 lb

## 2020-02-29 DIAGNOSIS — F5101 Primary insomnia: Secondary | ICD-10-CM | POA: Diagnosis not present

## 2020-02-29 DIAGNOSIS — R69 Illness, unspecified: Secondary | ICD-10-CM | POA: Diagnosis not present

## 2020-02-29 MED ORDER — TEMAZEPAM 15 MG PO CAPS: 15.0000 mg | ORAL_CAPSULE | Freq: Every evening | ORAL | 0 refills | Status: DC | PRN

## 2020-02-29 NOTE — Progress Notes (Signed)
Date:  02/29/2020   Name:  Cheyenne Cummings   DOB:  Aug 12, 1945   MRN:  [id number]   Chief Complaint: Insomnia (Patient coming in today complaining of not sleeping well.)  Insomnia Primary symptoms: frequent awakening.  The current episode started more than one month. The problem occurs nightly. The symptoms are aggravated by bed partner and anxiety (getting ready to move, sell the house, etc).    Lab Results  Component Value Date   CREATININE 0.70 11/12/2019   BUN 12 11/12/2019   NA 134 11/12/2019   K 4.8 11/12/2019   CL 98 11/12/2019   CO2 24 11/12/2019   Lab Results  Component Value Date   CHOL 172 11/12/2019   HDL 73 11/12/2019   LDLCALC 81 11/12/2019   TRIG 100 11/12/2019   CHOLHDL 2.4 11/12/2019   Lab Results  Component Value Date   TSH 1.000 11/12/2019   Lab Results  Component Value Date   HGBA1C 5.4 02/15/2019   Lab Results  Component Value Date   WBC 5.5 11/12/2019   HGB 13.0 11/12/2019   HCT 38.5 11/12/2019   MCV 93 11/12/2019   PLT 247 11/12/2019   Lab Results  Component Value Date   ALT 20 11/12/2019   AST 25 11/12/2019   ALKPHOS 86 11/12/2019   BILITOT 0.2 11/12/2019     Review of Systems  Constitutional: Negative for chills, fatigue and fever.  Respiratory: Negative for chest tightness and shortness of breath.   Cardiovascular: Negative for chest pain, palpitations and leg swelling.  Neurological: Negative for dizziness and headaches.  Psychiatric/Behavioral: The patient has insomnia.     Patient Active Problem List   Diagnosis Date Noted  . Aortic atherosclerosis ([place]) 11/24/2019  . History of total hip replacement, left 09/08/2018  . OSA on CPAP 05/11/2018  . Osteopenia of right hip 11/06/2017  . Hypothyroidism due to acquired atrophy of thyroid 10/02/2016  . Tinnitus of right ear 05/01/2016  . Ductal carcinoma in situ (DCIS) of right breast 09/18/2015  . Tobacco use disorder, moderate, in sustained remission, dependence  09/18/2015  . Alphaherpesviral disease 10/01/2014  . Hyperlipidemia 10/01/2014  . Anxiety disorder due to known physiological condition 10/01/2014  . Environmental and seasonal allergies 10/01/2014  . BB block 10/01/2014  . Edema extremities 10/01/2014  . Essential (primary) hypertension 10/01/2014  . History of colon polyps 10/01/2014  . Arthritis of knee, degenerative 10/01/2014  . Addison anemia 10/01/2014  . Plantar fasciitis 02/11/2014  . Radiation-induced polyneuropathy ([place]) 02/11/2014    No Known Allergies  Past Surgical History:  Procedure Laterality Date  . ABDOMINAL HYSTERECTOMY  May 2021   Total vaginal hysterectomy- completley healed and dismissed  . APPENDECTOMY    . ARTHROSCOPY WITH ANTERIOR CRUCIATE LIGAMENT (ACL) REPAIR WITH ANTERIOR TIBILIAS GRAFT Bilateral 1996, 2003  . BREAST BIOPSY Right 10/27/15   Benign - fat necrosis/post rad changes  . BREAST BIOPSY Right 02/25/2013   DCIS  . BREAST LUMPECTOMY Right 2014   DCIS - XRT , clear margins and negative LN  . BUNIONECTOMY Right 2015  . FOOT SURGERY Right 2018  . JOINT REPLACEMENT    . TONSILLECTOMY    . TOTAL HIP ARTHROPLASTY Left 2010  . TOTAL VAGINAL HYSTERECTOMY  09/13/2019  . TUBAL LIGATION    . TYMPANOPLASTY      Social History   Tobacco Use  . Smoking status: Light Tobacco Smoker    Packs/day: 1.00    Years: 50.00  Pack years: 50.00    Types: Cigarettes    Last attempt to quit: 08/28/2018    Years since quitting: 1.5  . Smokeless tobacco: Never Used  . Tobacco comment: currently enrolled in smoking cessation classes  Vaping Use  . Vaping Use: Never used  Substance Use Topics  . Alcohol use: Not Currently    Alcohol/week: 0.0 standard drinks    Comment: occ  . Drug use: No     Medication list has been reviewed and updated.  Current Meds  Medication Sig  . acyclovir (ZOVIRAX) 800 MG tablet 800 mg as needed.   . B Complex-C (SUPER B COMPLEX PO) Take by mouth.  . cholecalciferol  (VITAMIN D) 1000 UNITS tablet Take 1,000 Units by mouth daily.  . clonazePAM (KLONOPIN) 0.5 MG tablet Take 0.5 mg by mouth daily.   Marland Kitchen gabapentin (NEURONTIN) 300 MG capsule Take 300 mg by mouth 2 (two) times daily.   Marland Kitchen levothyroxine (SYNTHROID) 75 MCG tablet TAKE 1 TABLET DAILY  . loratadine (CLARITIN) 10 MG tablet Take 10 mg by mouth daily.  Marland Kitchen losartan (COZAAR) 50 MG tablet TAKE 1 TABLET DAILY  . lovastatin (MEVACOR) 40 MG tablet TAKE 1 TABLET AT BEDTIME  . mometasone (NASONEX) 50 MCG/ACT nasal spray Place 2 sprays into the nose daily. 50 mcg  . Multiple Vitamins-Minerals (MULTIVITAMIN WITH MINERALS) tablet Take 1 tablet by mouth daily.  . NON FORMULARY CPAP @@ 8 cm H2O  . venlafaxine XR (EFFEXOR-XR) 75 MG 24 hr capsule Take 1 capsule by mouth one time daily    PHQ 2/9 Scores 02/29/2020 12/01/2019 11/12/2019 07/06/2019  PHQ - 2 Score 2 0 0 0  PHQ- 9 Score 8 - 0 0    GAD 7 : Generalized Anxiety Score 02/29/2020 11/12/2019 07/06/2019  Nervous, Anxious, on Edge 2 1 0  Control/stop worrying 0 1 0  Worry too much - different things 0 1 0  Trouble relaxing 3 0 0  Restless 3 0 0  Easily annoyed or irritable 0 0 0  Afraid - awful might happen 0 0 0  Total GAD 7 Score 8 3 0  Anxiety Difficulty Somewhat difficult Not difficult at all Not difficult at all    BP Readings from Last 3 Encounters:  02/29/20 124/78  11/12/19 112/62  07/06/19 120/80    Physical Exam Vitals and nursing note reviewed.  Constitutional:      General: She is not in acute distress.    Appearance: Normal appearance. She is well-developed.  HENT:     Head: Normocephalic and atraumatic.  Cardiovascular:     Rate and Rhythm: Normal rate and regular rhythm.     Heart sounds: No murmur heard.   Pulmonary:     Effort: Pulmonary effort is normal. No respiratory distress.     Breath sounds: No wheezing or rhonchi.  Musculoskeletal:        General: Normal range of motion.     Cervical back: Normal range of motion.    Lymphadenopathy:     Cervical: No cervical adenopathy.  Skin:    General: Skin is warm and dry.     Findings: No rash.  Neurological:     Mental Status: She is alert and oriented to person, place, and time.  Psychiatric:        Mood and Affect: Mood normal.     Wt Readings from Last 3 Encounters:  02/29/20 151 lb (68.5 kg)  11/22/19 148 lb (67.1 kg)  11/12/19 147 lb (66.7  kg)    BP 124/78   Pulse 74   Temp 98 F (36.7 C) (Oral)   Ht 5\' 4"  (1.626 m)   Wt 151 lb (68.5 kg)   SpO2 97%   BMI 25.92 kg/m   Assessment and Plan: 1. Primary insomnia Begin temazepam at bedtime PRN Call for refill at the new pharmacy in [place] if needed - temazepam (RESTORIL) 15 MG capsule; Take 1 capsule (15 mg total) by mouth at bedtime as needed for sleep.  Dispense: 30 capsule; Refill: 0   Partially dictated using Editor, commissioning. Any errors are unintentional.  Halina Maidens, MD [facility]  02/29/2020

## 2020-02-29 NOTE — Patient Instructions (Signed)
Epic health record system  Ask about an FL-2 form for [employer]

## 2020-03-07 ENCOUNTER — Other Ambulatory Visit: Payer: Self-pay | Admitting: Internal Medicine

## 2020-03-08 DIAGNOSIS — Z86018 Personal history of other benign neoplasm: Secondary | ICD-10-CM | POA: Diagnosis not present

## 2020-03-08 DIAGNOSIS — Z85820 Personal history of malignant melanoma of skin: Secondary | ICD-10-CM | POA: Diagnosis not present

## 2020-03-08 DIAGNOSIS — Z872 Personal history of diseases of the skin and subcutaneous tissue: Secondary | ICD-10-CM | POA: Diagnosis not present

## 2020-03-08 DIAGNOSIS — L578 Other skin changes due to chronic exposure to nonionizing radiation: Secondary | ICD-10-CM | POA: Diagnosis not present

## 2020-03-13 ENCOUNTER — Telehealth: Payer: Self-pay

## 2020-03-13 ENCOUNTER — Other Ambulatory Visit: Payer: Self-pay | Admitting: Internal Medicine

## 2020-03-13 ENCOUNTER — Encounter: Payer: Self-pay | Admitting: Internal Medicine

## 2020-03-13 DIAGNOSIS — F5101 Primary insomnia: Secondary | ICD-10-CM

## 2020-03-13 NOTE — Telephone Encounter (Signed)
Sent to Dr Army Melia for review.

## 2020-03-13 NOTE — Telephone Encounter (Signed)
Patient is moving to [facility] and would need a refill for temazepam (RESTORIL) 15 MG capsule [[id number]]   [id number] [address], [address] [place], [place] [address]  Phone:  [phone] Fax:  [phone]  DEA #:  [id number]

## 2020-03-19 ENCOUNTER — Encounter: Payer: Self-pay | Admitting: Internal Medicine

## 2020-03-26 ENCOUNTER — Encounter: Payer: Self-pay | Admitting: Internal Medicine

## 2020-03-27 ENCOUNTER — Other Ambulatory Visit: Payer: Self-pay | Admitting: Internal Medicine

## 2020-03-27 DIAGNOSIS — F5101 Primary insomnia: Secondary | ICD-10-CM

## 2020-03-27 MED ORDER — TEMAZEPAM 15 MG PO CAPS: 15.0000 mg | ORAL_CAPSULE | Freq: Every evening | ORAL | 1 refills | Status: DC | PRN

## 2020-03-27 MED ORDER — TEMAZEPAM 15 MG PO CAPS: 15.0000 mg | ORAL_CAPSULE | Freq: Every evening | ORAL | 1 refills | Status: AC | PRN

## 2020-03-30 ENCOUNTER — Other Ambulatory Visit: Payer: Self-pay | Admitting: Internal Medicine

## 2020-03-30 NOTE — Telephone Encounter (Signed)
Requested Prescriptions  Pending Prescriptions Disp Refills  . venlafaxine XR (EFFEXOR-XR) 75 MG 24 hr capsule [Pharmacy Med Name: VENLAFAXINE HCL ER 75 MG CAP] 90 capsule 0    Sig: Take 1 capsule by mouth one time daily     Psychiatry: Antidepressants - SNRI - desvenlafaxine & venlafaxine Failed - 03/30/2020 10:25 AM      Failed - LDL in normal range and within 360 days    LDL Chol Calc (NIH)  Date Value Ref Range Status  11/12/2019 81 0 - 99 mg/dL Final         Passed - Total Cholesterol in normal range and within 360 days    Cholesterol, Total  Date Value Ref Range Status  11/12/2019 172 100 - 199 mg/dL Final         Passed - Triglycerides in normal range and within 360 days    Triglycerides  Date Value Ref Range Status  11/12/2019 100 0 - 149 mg/dL Final         Passed - Last BP in normal range    BP Readings from Last 1 Encounters:  02/29/20 124/78         Passed - Valid encounter within last 6 months    Recent Outpatient Visits          1 month ago Primary insomnia   [facility] Glean Hess, MD   4 months ago Annual physical exam   [facility] Glean Hess, MD   8 months ago Tobacco use disorder   [facility] Glean Hess, MD   1 year ago Hypothyroidism due to acquired atrophy of thyroid   [facility] Glean Hess, MD   1 year ago Annual physical exam   [facility] Glean Hess, MD

## 2020-04-03 ENCOUNTER — Other Ambulatory Visit: Payer: Self-pay | Admitting: Internal Medicine

## 2020-04-14 DIAGNOSIS — G4733 Obstructive sleep apnea (adult) (pediatric): Secondary | ICD-10-CM | POA: Diagnosis not present

## 2020-05-01 ENCOUNTER — Ambulatory Visit: Payer: Medicare HMO | Admitting: Internal Medicine

## 2020-07-19 ENCOUNTER — Other Ambulatory Visit: Payer: Self-pay | Admitting: Internal Medicine

## 2020-07-19 MED ORDER — LEVOTHYROXINE SODIUM 75 MCG PO TABS
75.0000 ug | ORAL_TABLET | Freq: Every day | ORAL | 3 refills | Status: DC
Start: 2020-07-19 — End: 2020-09-08

## 2020-07-19 NOTE — Telephone Encounter (Signed)
Medication Refill - Medication: levothyroxine (SYNTHROID) 75 MCG tablet     Preferred Pharmacy (with phone number or street name):  [facility], [place] Phone:  [phone]  Fax:  [phone]       Agent: Please be advised that RX refills may take up to 3 business days. We ask that you follow-up with your pharmacy.

## 2020-07-19 NOTE — Telephone Encounter (Signed)
Requested Prescriptions  Pending Prescriptions Disp Refills  . levothyroxine (SYNTHROID) 75 MCG tablet 90 tablet 3    Sig: Take 1 tablet (75 mcg total) by mouth daily.     Endocrinology:  Hypothyroid Agents Failed - 07/19/2020 10:51 AM      Failed - TSH needs to be rechecked within 3 months after an abnormal result. Refill until TSH is due.      Passed - TSH in normal range and within 360 days    TSH  Date Value Ref Range Status  11/12/2019 1.000 0.450 - 4.500 uIU/mL Final         Passed - Valid encounter within last 12 months    Recent Outpatient Visits          4 months ago Primary insomnia   [facility] Glean Hess, MD   8 months ago Annual physical exam   [facility] Glean Hess, MD   1 year ago Tobacco use disorder   [facility] Glean Hess, MD   1 year ago Hypothyroidism due to acquired atrophy of thyroid   [facility] Glean Hess, MD   1 year ago Annual physical exam   [facility] Glean Hess, MD

## 2020-09-08 ENCOUNTER — Other Ambulatory Visit: Payer: Self-pay | Admitting: Internal Medicine

## 2020-11-15 ENCOUNTER — Encounter: Payer: Medicare HMO | Admitting: Internal Medicine

## 2020-12-04 ENCOUNTER — Ambulatory Visit: Payer: Medicare HMO

## 2020-12-28 NOTE — Telephone Encounter (Signed)
complete

## 2021-05-28 ENCOUNTER — Other Ambulatory Visit: Payer: Self-pay | Admitting: Internal Medicine

## 2022-01-01 ENCOUNTER — Telehealth: Payer: Self-pay | Admitting: *Deleted

## 2022-01-01 NOTE — Telephone Encounter (Signed)
Left message on voicemail to call to schedule  follow up [facility] CT scan.
